# Patient Record
Sex: Male | Born: 2000 | Race: Black or African American | Hispanic: No | Marital: Single | Smoking: Never smoker
Health system: Southern US, Community
[De-identification: ages and names within clinical notes are randomized; demographics above are authoritative.]

## PROBLEM LIST (undated history)

## (undated) ENCOUNTER — Emergency Department (HOSPITAL_COMMUNITY): Admission: EM | Payer: Medicare Other | Source: Home / Self Care

## (undated) DIAGNOSIS — R2681 Unsteadiness on feet: Secondary | ICD-10-CM

## (undated) DIAGNOSIS — H47619 Cortical blindness, unspecified side of brain: Secondary | ICD-10-CM

## (undated) DIAGNOSIS — Z982 Presence of cerebrospinal fluid drainage device: Secondary | ICD-10-CM

## (undated) DIAGNOSIS — R4189 Other symptoms and signs involving cognitive functions and awareness: Secondary | ICD-10-CM

## (undated) DIAGNOSIS — J45909 Unspecified asthma, uncomplicated: Secondary | ICD-10-CM

## (undated) DIAGNOSIS — IMO0002 Reserved for concepts with insufficient information to code with codable children: Secondary | ICD-10-CM

## (undated) DIAGNOSIS — R1311 Dysphagia, oral phase: Secondary | ICD-10-CM

## (undated) DIAGNOSIS — Q860 Fetal alcohol syndrome (dysmorphic): Secondary | ICD-10-CM

## (undated) DIAGNOSIS — G809 Cerebral palsy, unspecified: Secondary | ICD-10-CM

## (undated) DIAGNOSIS — H4720 Unspecified optic atrophy: Secondary | ICD-10-CM

## (undated) DIAGNOSIS — R569 Unspecified convulsions: Secondary | ICD-10-CM

## (undated) DIAGNOSIS — Z9181 History of falling: Secondary | ICD-10-CM

## (undated) DIAGNOSIS — Q039 Congenital hydrocephalus, unspecified: Secondary | ICD-10-CM

## (undated) DIAGNOSIS — F909 Attention-deficit hyperactivity disorder, unspecified type: Secondary | ICD-10-CM

## (undated) HISTORY — PX: CSF SHUNT: SHX92

## (undated) HISTORY — PX: OTHER SURGICAL HISTORY: SHX169

## (undated) HISTORY — PX: HERNIA REPAIR: SHX51

---
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
## 2020-02-22 ENCOUNTER — Other Ambulatory Visit: Payer: Self-pay

## 2020-02-22 ENCOUNTER — Ambulatory Visit (INDEPENDENT_AMBULATORY_CARE_PROVIDER_SITE_OTHER): Payer: Medicaid Other | Admitting: Family Medicine

## 2020-02-22 ENCOUNTER — Encounter: Payer: Self-pay | Admitting: Family Medicine

## 2020-02-22 VITALS — BP 97/70 | HR 68 | Ht 62.21 in | Wt 96.8 lb

## 2020-02-22 DIAGNOSIS — E063 Autoimmune thyroiditis: Secondary | ICD-10-CM | POA: Diagnosis not present

## 2020-02-22 DIAGNOSIS — R6252 Short stature (child): Secondary | ICD-10-CM

## 2020-02-22 DIAGNOSIS — Z7689 Persons encountering health services in other specified circumstances: Secondary | ICD-10-CM

## 2020-02-22 DIAGNOSIS — R636 Underweight: Secondary | ICD-10-CM

## 2020-02-22 DIAGNOSIS — D696 Thrombocytopenia, unspecified: Secondary | ICD-10-CM

## 2020-02-22 DIAGNOSIS — H47619 Cortical blindness, unspecified side of brain: Secondary | ICD-10-CM

## 2020-02-22 NOTE — Patient Instructions (Addendum)
Wonderful to meet you guys today. We will get the records from [facility] pediatrics and then assess if we need to get follow-up labs on his next visit. Please make sure he is getting a well-balanced diet to maintain his weight. I would like to see you guys back in approximately 1-2 months or sooner if needed.

## 2020-02-22 NOTE — Assessment & Plan Note (Signed)
Appears to be stable on Synthroid 50 mcg.  Obtain records as discussed above, will get TSH on follow-up if not drawn recently.

## 2020-02-22 NOTE — Progress Notes (Signed)
    SUBJECTIVE:   CHIEF COMPLAINT / HPI: Establish care/new patient  Aaron Heath is an 19 year old male presenting to establish care:  Presents with his caretaker today, under DSS care.  Lives with his caretaker, has been staying with her for the past year and is going well.  Does not believe they need any additional assistance.  Both of his parents are deceased, passed away when he was younger.  They have no concerns or complaints today.  He is getting his first Covid vaccine tomorrow, [employer].  Reports he does not cook or drive.  Bathes on his own.  Cannot dress or use the restroom without verbal cues.  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 Hyacinth Meeker  Neurologist: [facility], once yearly    Psychiatrist: Dr. Jannifer Franklin   PT- hasn't seen since COVID   Neurosurgeon: [facility], once yearly  Eye doctor: Follows with [facility], due for follow-up  Social history: Goes to [employer] for school M-F in person.  Lives with caretaker.  Enjoys playing at the park for fun.  No alcohol or tobacco use.  Medications:  Abilify 10 mg twice daily  Zyrtec 10 mg daily  Depakote 500 mg twice daily  Synthroid 50 mcg daily  MiraLAX every other day  Melatonin 3 mg  Hydroxyzine 25 mg as needed  Guanfacine 2mg    Past surgical history: Eye surgery in 2013, VP shunt placement for hydrocephalus  Family history: No significant family history of early heart attack or strokes.  OBJECTIVE:   BP 97/70   Pulse 68   Ht 5' 2.21"  (1.58 m)   Wt 96 lb 12.8 oz (43.9 kg)   SpO2 99%   BMI 17.59 kg/m   General: Alert, NAD HEENT: NCAT, VP shunt palpable through neck/chest with previous posterior cranial scar, MMM, hypertropia Cardiac: RRR no m/g/r Lungs: Clear bilaterally, no increased WOB  Abdomen: soft Msk: Moves all extremities spontaneously, can walk without assistance Ext: Warm, dry, 2+ distal pulses Psych: Moderate mental disability, can follow commands and answer basic questions.   ASSESSMENT/PLAN:   Encounter to establish care with new doctor Reviewed past medical, surgical, and social history with patient and caretaker.  Reviewed and updated medications.  Will obtain records from [facility] pediatrics, previous PCP.  Cortical blindness Reports due to follow-up with ophthalmologist, caretaker to schedule appointment.  Low weight <1st percentile for weight, appears to be his general trajectory over the past several years.  Caretaker reports he eats well, encouraged nutrition dense foods to maintain weight.  Previously tried Ensure without success.   Thrombocytopenia, unspecified Unclear etiology at this point.  Will obtain records from [facility] pediatrics to assess and see if they have recently obtained a CBC.  If not, will get a CBC on follow-up.  Hypothyroidism, acquired, autoimmune Appears to be stable on Synthroid 50 mcg.  Obtain records as discussed above, will get TSH on follow-up if not drawn recently.    Follow-up in approximately 2 months or sooner if needed.  [facility], DO [place] [facility]

## 2020-02-22 NOTE — Assessment & Plan Note (Signed)
<  1st percentile for weight, appears to be his general trajectory over the past several years.  Caretaker reports he eats well, encouraged nutrition dense foods to maintain weight.  Previously tried Ensure without success.

## 2020-02-22 NOTE — Assessment & Plan Note (Signed)
Reviewed past medical, surgical, and social history with patient and caretaker.  Reviewed and updated medications.  Will obtain records from [facility] pediatrics, previous PCP.

## 2020-02-22 NOTE — Assessment & Plan Note (Signed)
Unclear etiology at this point.  Will obtain records from [facility] pediatrics to assess and see if they have recently obtained a CBC.  If not, will get a CBC on follow-up.

## 2020-02-22 NOTE — Assessment & Plan Note (Signed)
Reports due to follow-up with ophthalmologist, caretaker to schedule appointment.

## 2020-04-09 ENCOUNTER — Ambulatory Visit: Payer: Medicaid Other | Admitting: Family Medicine

## 2020-04-09 NOTE — Progress Notes (Deleted)
    SUBJECTIVE:   CHIEF COMPLAINT / HPI: Follow up   HPI: Aaron Heath is a 19 year old male presenting with his caretaker for follow-up.  Recently established care within our clinic in 02/2020.  ***  PERTINENT  PMH / PSH: Cerebral palsy, fetal alcohol syndrome, congenital hydrocephalus s/p VP shunt, acquired hypothyroidism on Synthroid replacement, moderate mental disability, seizure disorder, cortical blindness, derma cytopenia, ADHD with oppositional defiant disorder  OBJECTIVE:   There were no vitals taken for this visit.  ***  ASSESSMENT/PLAN:   No problem-specific Assessment & Plan notes found for this encounter.     Allayne Stack, DO [place] [facility]

## 2020-07-09 ENCOUNTER — Ambulatory Visit: Payer: Medicaid Other | Admitting: Family Medicine

## 2020-07-09 NOTE — Progress Notes (Deleted)
    SUBJECTIVE:   CHIEF COMPLAINT / HPI: Follow-up  Aaron Heath is an 19 year old gentleman presenting with his caretaker for follow-up.  He recently established care within our clinic in 02/2020.  During his initial evaluation, we discussed obtaining a TSH and CBC pending records from [facility] pediatrics.  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  ***  ASSESSMENT/PLAN:   No problem-specific Assessment & Plan notes found for this encounter.     Allayne Stack, DO [place] [facility]

## 2020-07-25 ENCOUNTER — Other Ambulatory Visit: Payer: Self-pay

## 2020-07-25 ENCOUNTER — Ambulatory Visit (INDEPENDENT_AMBULATORY_CARE_PROVIDER_SITE_OTHER): Payer: Medicaid Other | Admitting: Family Medicine

## 2020-07-25 VITALS — BP 98/60 | HR 82 | Temp 98.5°F | Wt 89.0 lb

## 2020-07-25 DIAGNOSIS — D696 Thrombocytopenia, unspecified: Secondary | ICD-10-CM

## 2020-07-25 DIAGNOSIS — E063 Autoimmune thyroiditis: Secondary | ICD-10-CM

## 2020-07-25 DIAGNOSIS — Z68.41 Body mass index (BMI) pediatric, less than 5th percentile for age: Secondary | ICD-10-CM

## 2020-07-25 DIAGNOSIS — J302 Other seasonal allergic rhinitis: Secondary | ICD-10-CM | POA: Diagnosis not present

## 2020-07-25 DIAGNOSIS — R636 Underweight: Secondary | ICD-10-CM | POA: Diagnosis not present

## 2020-07-25 MED ORDER — CETIRIZINE HCL 10 MG PO TABS: 10.0000 mg | ORAL_TABLET | Freq: Every day | ORAL | 3 refills | Status: DC

## 2020-07-25 MED ORDER — ENSURE ENLIVE PO LIQD: 237.0000 mL | Freq: Two times a day (BID) | ORAL | 12 refills | Status: DC

## 2020-07-25 NOTE — Patient Instructions (Signed)
Getting thyroid and platelet levels. Will call with results in the next few days.   Please start giving ensure twice daily in between meals for weight gain.

## 2020-07-25 NOTE — Progress Notes (Signed)
    SUBJECTIVE:   CHIEF COMPLAINT / HPI: Follow-up  Aaron Heath is a 19 year old male presenting with his caretaker for follow-up.  Last seen in March when establishing care in our clinic.  He and his caretaker report that he has been doing great.  No concerns today. Received his Pfizer on 3/11 and 03/15/2020. Eating wonderfully and has a good appetite per caretaker report, plenty of lean meats/protein. Been swimming and went to the beach recently.  Just completed summer school, going into 12th grade. Virtual appt with his neurologist coming up in the next few months.  Does not need any refills of his medications.  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  Will check TSH today.  Thrombocytopenia, unspecified Will obtain CBC to assess if persistent.  Low weight BMI 16, weight has down trended since last visit despite adequate appetite and food intake per caretaker report.  Encouraged adding Ensure BID between meals to see if he can continue to maintain weight in addition to offering nutritionally dense foods such as avocado, sweet potato  etc. Will monitor closely.  Seasonal allergies Rx Zyrtec.    Follow-up in 1 month to check in on weight, or sooner if labs significantly abnormal.  Allayne Stack, DO [facility]

## 2020-07-27 ENCOUNTER — Encounter: Payer: Self-pay | Admitting: Family Medicine

## 2020-07-27 DIAGNOSIS — J302 Other seasonal allergic rhinitis: Secondary | ICD-10-CM | POA: Insufficient documentation

## 2020-07-27 LAB — CBC
Hematocrit: 48.2 % (ref 37.5–51.0)
Hemoglobin: 16.3 g/dL (ref 13.0–17.7)
MCH: 29.7 pg (ref 26.6–33.0)
MCHC: 33.8 g/dL (ref 31.5–35.7)
MCV: 88 fL (ref 79–97)
RBC: 5.49 x10E6/uL (ref 4.14–5.80)
RDW: 13.9 % (ref 11.6–15.4)
WBC: 2.7 10*3/uL — ABNORMAL LOW (ref 3.4–10.8)

## 2020-07-27 LAB — TSH: TSH: 1.45 u[IU]/mL (ref 0.450–4.500)

## 2020-07-27 LAB — HEPATITIS C ANTIBODY: Hep C Virus Ab: <0.1 s/co ratio (ref 0.0–0.9)

## 2020-07-27 NOTE — Assessment & Plan Note (Signed)
Will obtain CBC to assess if persistent.

## 2020-07-27 NOTE — Assessment & Plan Note (Addendum)
BMI 16, weight has down trended since last visit despite adequate appetite and food intake per caretaker report.  Encouraged adding Ensure BID between meals to see if he can continue to maintain weight in addition to offering nutritionally dense foods such as avocado, sweet potato etc. Will monitor closely.

## 2020-07-27 NOTE — Assessment & Plan Note (Signed)
- 

## 2020-07-27 NOTE — Assessment & Plan Note (Signed)
Currently on Synthroid 50 mcg.  Will check TSH today.

## 2020-08-22 ENCOUNTER — Ambulatory Visit (INDEPENDENT_AMBULATORY_CARE_PROVIDER_SITE_OTHER): Payer: Medicaid Other | Admitting: Family Medicine

## 2020-08-22 ENCOUNTER — Other Ambulatory Visit: Payer: Self-pay

## 2020-08-22 VITALS — BP 99/70 | HR 63 | Ht 63.58 in | Wt 91.4 lb

## 2020-08-22 DIAGNOSIS — D696 Thrombocytopenia, unspecified: Secondary | ICD-10-CM

## 2020-08-22 DIAGNOSIS — R627 Adult failure to thrive: Secondary | ICD-10-CM

## 2020-08-22 NOTE — Progress Notes (Signed)
    SUBJECTIVE:   CHIEF COMPLAINT / HPI: Follow-up weight  Aaron Heath is a 19 year old male presenting for a weight check.  He was last seen on 8/11 for a regular follow-up.  He weighed 40.4 kg during that visit which was down from 43.9 kg in 02/2020.  He has always been under the 3rd percentile in weight.  His caretaker states he eats extremely well, eats 3 meals a day and has added the Ensure shakes twice daily.  He likes the Ensure shakes and has been a good addition.  Enjoys salads and lean meats.  Otherwise acting like himself.  No change in behavior.  Denies any recent fever, difficulty breathing, abdominal pain, melena/hematochezia.  He also has a history of thrombocytopenia and attempted to recheck last visit however platelets clumped.  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  Recommended increasing Ensure to TID between meals if he can maintain appropriate intake with meals in addition to this.  Will additionally place dietitian consult for in-depth caloric evaluation.  HIV to r/o alternative etiology, however doubtful.  TSH recently WNL.  Thrombocytopenia, unspecified Longstanding and previously stable.  Will recheck CBC today.    Follow-up in 1 month or sooner if  needed.  Allayne Stack, DO [place] [facility]

## 2020-08-22 NOTE — Patient Instructions (Signed)
It was wonderful to see you guys today.  We are going to recheck his platelets and hopefully the lab will go through this time.  Fortunately he has increased his weight a little bit since last visit.  We can consider increasing his Ensure to 3 times daily to see if this helps.  I will also place referral to nutritionist for more in-depth evaluation to make sure that we are not missing any nutrition gaps.  Follow-up in 1-2 months pending nutrition eval.

## 2020-08-23 LAB — CBC WITH DIFFERENTIAL/PLATELET
Basophils Absolute: 0 10*3/uL (ref 0.0–0.2)
Basos: 0 %
EOS (ABSOLUTE): 0 10*3/uL (ref 0.0–0.4)
Eos: 1 %
Hematocrit: 40.3 % (ref 37.5–51.0)
Hemoglobin: 13.4 g/dL (ref 13.0–17.7)
Immature Grans (Abs): 0 10*3/uL (ref 0.0–0.1)
Immature Granulocytes: 0 %
Lymphocytes Absolute: 2.9 10*3/uL (ref 0.7–3.1)
Lymphs: 56 %
MCH: 29.4 pg (ref 26.6–33.0)
MCHC: 33.3 g/dL (ref 31.5–35.7)
MCV: 88 fL (ref 79–97)
Monocytes Absolute: 0.4 10*3/uL (ref 0.1–0.9)
Monocytes: 8 %
Neutrophils Absolute: 1.8 10*3/uL (ref 1.4–7.0)
Neutrophils: 35 %
Platelets: 105 10*3/uL — ABNORMAL LOW (ref 150–450)
RBC: 4.56 x10E6/uL (ref 4.14–5.80)
RDW: 14.1 % (ref 11.6–15.4)
WBC: 5.1 10*3/uL (ref 3.4–10.8)

## 2020-08-23 LAB — HIV ANTIBODY (ROUTINE TESTING W REFLEX): HIV Screen 4th Generation wRfx: NONREACTIVE

## 2020-08-25 ENCOUNTER — Encounter: Payer: Self-pay | Admitting: Family Medicine

## 2020-08-25 DIAGNOSIS — R627 Adult failure to thrive: Secondary | ICD-10-CM | POA: Insufficient documentation

## 2020-08-25 NOTE — Assessment & Plan Note (Signed)
Longstanding and previously stable.  Will recheck CBC today.

## 2020-08-25 NOTE — Assessment & Plan Note (Addendum)
Weight up 1 kg since last visit, however still down from previous.  Recommended increasing Ensure to TID between meals if he can maintain appropriate intake with meals in addition to this.  Will additionally place dietitian consult for in-depth caloric evaluation.  HIV to r/o alternative etiology, however doubtful.  TSH recently WNL.

## 2020-09-20 ENCOUNTER — Ambulatory Visit: Payer: Medicaid Other | Admitting: Registered"

## 2020-09-26 ENCOUNTER — Ambulatory Visit (INDEPENDENT_AMBULATORY_CARE_PROVIDER_SITE_OTHER): Payer: Medicaid Other | Admitting: Family Medicine

## 2020-09-26 ENCOUNTER — Other Ambulatory Visit: Payer: Self-pay

## 2020-09-26 ENCOUNTER — Encounter: Payer: Self-pay | Admitting: Family Medicine

## 2020-09-26 VITALS — BP 98/56 | HR 56 | Ht 64.0 in | Wt 92.0 lb

## 2020-09-26 DIAGNOSIS — R636 Underweight: Secondary | ICD-10-CM | POA: Diagnosis not present
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
## 2020-09-26 NOTE — Patient Instructions (Addendum)
It was wonderful to see you both today.  I am glad to hear that he is doing well and will be establishing with dietitian here soon.  I encourage you to continue with the Ensure shakes and eating as regular.  Please follow-up in about 2 months to check in or sooner if needed.

## 2020-09-27 ENCOUNTER — Encounter: Payer: Self-pay | Admitting: Family Medicine

## 2020-09-27 NOTE — Assessment & Plan Note (Addendum)
Up 0.2 kg since last visit in 08/2020.  Do question if 02/2020 weigh in was inaccurate leaning towards a general poor weight growth other than decreased.  Reassured by appropriate appetite and ability to eat, likely has increased expenditure in the setting of CP.  Keep Ensure BID/TID. Maintain appointment with dietitian in 10/2020 to ensure meeting adequate caloric needs.

## 2020-10-17 ENCOUNTER — Encounter: Payer: Self-pay | Admitting: Registered"

## 2020-10-17 ENCOUNTER — Encounter: Payer: Medicaid Other | Attending: Family Medicine | Admitting: Registered"

## 2020-10-17 ENCOUNTER — Other Ambulatory Visit: Payer: Self-pay

## 2020-10-17 DIAGNOSIS — Z713 Dietary counseling and surveillance: Secondary | ICD-10-CM

## 2020-10-17 NOTE — Patient Instructions (Addendum)
-   Find out what he's eating at school.   - Have whole milk as beverage option while at school with breakfast and lunch.   - Switch from Ensure to Ensure Plus 2-3x/day.

## 2020-10-17 NOTE — Progress Notes (Signed)
Medical Nutrition Therapy:  Appt start time: 8:18 end time:  9:00.  Assessment:  Primary concerns today: Pt arrives with caretaker. Pt lives with caretaker. Has been living with her form 2 years. Previous notes state pt has trended less than 1% on weight-for-age growth charts throughout life. Recent note, 09/26/2020, states pt is around 0.01% and has experienced decline from baseline. Needs to increase nutritional intake.  Caretaker states PCP says he has something going on with his platelets and possibly the cause of unable to gain weight. Reports his weight is up and down.   Caretaker states he gets home late during the week (on Tuesdays and Thursdays)  around 7:30-8 pm and its time for him to get ready for bed; unable to have bedtime snack. States he is with a Surveyor, minerals during this time. Will eat dinner with him. States she has to be mindful of how much he is taking in so he doesn't soil his clothes. Reports his day starts at 5 am, therefore goes to bed early. States on Mondays and Fridays, she picks him up from school and he is able to have 3 Ensures/day, with breakfast, after school snack, and dinner.   Reports pt eats breakfast 2x/day, once at home and then at school. Reports he also eats lunch at school, after school snack at home, and dinner at home. Reports pt loves salad and will prioritize that over other parts of the meal. Caretaker states she tries to feed him starches that include vegetables to help with weight gain. Reports pt will eat some pasta dishes such as spaghetti, but not alfredo. Pt does not like bread unless its pizza.    Expectations: none stated  Preferred Learning Style:   No preference indicated   Learning Readiness:   Ready  Change in progress   MEDICATIONS: See list   DIETARY INTAKE:  Usual eating pattern includes 4 meals and 1 snack per day.  Everyday foods include boiled eggs, sausage links, cereal, .  Avoided foods include red meat, vegetables  (salads), sandwiches, Ramen noodles, bread.    24-hr recall:  B ( AM): Ensure + 2 boiled eggs + 2 chicken sausage links or cereal with whole milk B ( AM): unsure; eats at school Snk ( AM):  L ( PM): unsure; eats at school  Snk ( PM): Ensure + ind bag of chips D ( PM): Ensure (sometimes) + 3 soft tacos (veggie crumbles, cheese, lettuce, tomato, sour cream) Snk ( PM):  Beverages: Ensure   Usual physical activity: none reported, walking sometimes with worker  Estimated energy needs: 2600-2800 calories 325-350 g carbohydrates 130-140 g protein 87-93 g fat  Progress Towards Goal(s):  In progress.   Nutritional Diagnosis:  [place]-3.1 Underweight As related to lack of appropriate weight gain per day.  As evidenced by weight for age < 0.01 % .    Intervention: Caretaker was educated and counseled on ways to increase nourishment for patient. Discussed benefits of having Ensure Plus rather than Ensure. Encouraged caretaker to continue having higher fat and calorically dense food options. Caretaker was in agreement with goals listed. Goals: - Find out what he's eating at school.  - Have whole milk as beverage option while at school with breakfast and lunch.  - Switch from Ensure to Ensure Plus 2-3x/day.   Teaching Method Utilized:  Visual Auditory Hands on  Handouts given during visit include:  High-calorie Nutrition Therapy  Barriers to learning/adherence to lifestyle change: none identified  Demonstrated degree of understanding  via:  Teach Back   Monitoring/Evaluation:  Dietary intake, exercise, and body weight in 1 month(s).

## 2020-11-21 ENCOUNTER — Encounter: Payer: Self-pay | Admitting: Family Medicine

## 2020-11-21 ENCOUNTER — Ambulatory Visit (INDEPENDENT_AMBULATORY_CARE_PROVIDER_SITE_OTHER): Payer: Medicaid Other | Admitting: Family Medicine

## 2020-11-21 ENCOUNTER — Other Ambulatory Visit: Payer: Self-pay

## 2020-11-21 VITALS — BP 99/60 | HR 62 | Ht 64.0 in | Wt 94.0 lb

## 2020-11-21 DIAGNOSIS — E063 Autoimmune thyroiditis: Secondary | ICD-10-CM

## 2020-11-21 DIAGNOSIS — Z23 Encounter for immunization: Secondary | ICD-10-CM | POA: Diagnosis not present

## 2020-11-21 DIAGNOSIS — J302 Other seasonal allergic rhinitis: Secondary | ICD-10-CM | POA: Diagnosis not present

## 2020-11-21 DIAGNOSIS — R627 Adult failure to thrive: Secondary | ICD-10-CM | POA: Diagnosis not present

## 2020-11-21 DIAGNOSIS — R636 Underweight: Secondary | ICD-10-CM

## 2020-11-21 DIAGNOSIS — Z0000 Encounter for general adult medical examination without abnormal findings: Secondary | ICD-10-CM | POA: Diagnosis not present

## 2020-11-21 DIAGNOSIS — G4700 Insomnia, unspecified: Secondary | ICD-10-CM

## 2020-11-21 MED ORDER — CETIRIZINE HCL 10 MG PO TABS: 10.0000 mg | ORAL_TABLET | Freq: Every day | ORAL | 3 refills | Status: DC

## 2020-11-21 MED ORDER — LEVOTHYROXINE SODIUM 50 MCG PO TABS: 50.0000 ug | ORAL_TABLET | Freq: Every day | ORAL | 1 refills | Status: DC

## 2020-11-21 MED ORDER — MELATONIN 3 MG PO TABS: 3.0000 mg | ORAL_TABLET | Freq: Every day | ORAL | 3 refills | Status: DC

## 2020-11-21 MED ORDER — CENTRUM PO CHEW: 1.0000 | CHEWABLE_TABLET | Freq: Every day | ORAL | 3 refills | Status: AC

## 2020-11-21 MED ORDER — VITAMIN D3 25 MCG (1000 UNIT) PO TABS: 1000.0000 [IU] | ORAL_TABLET | Freq: Every day | ORAL | 3 refills | Status: DC

## 2020-11-21 NOTE — Patient Instructions (Addendum)
So glad to see he is gaining weight!  Keep up with the ensure plus twice or three times daily.  Work with nutrition dense foods.  Have a wonderful holiday!

## 2020-11-21 NOTE — Assessment & Plan Note (Addendum)
Weight continues to slowly uptrend, up 1 kg since last visit however still down from previous (still question if previous inaccurate weights may be leaning towards general poor weight growth rather than decreased).  May be in setting of increased expenditure with known CP chronic comorbidities.  Will continue regimen as is with Ensure Plus BID/TID, daily MVI, and adding nutritionally dense meals. Continue follow-up with dietitian team, next appointment tomorrow, 12/9.

## 2020-11-21 NOTE — Progress Notes (Signed)
    SUBJECTIVE:   CHIEF COMPLAINT / HPI: Follow-up weight  Aaron Heath is a 19 year old male presenting with his caretaker to follow-up his weight.  He has been doing well since our last visit in October.  Met with dietitian in November, recommended transitioning to Ensure Plus and continue adding nutritionally dense foods.  She feels like her appointment with the dietitian, Clementeen Hoof, was very helpful.  They have a follow-up appointment with her tomorrow.  His caretaker is continue to add breads and potatoes into his diet, discussed this with his school team to make sure he is getting adequate meals while at school.  Still taking Ensure Plus 2-3 times daily.  Following up with his neurologist and psychiatrist within the next month.  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 with improved vision,Thrombocytopenia- unclear etiology,ADHD, oppositional defiant  OBJECTIVE:   BP 99/60   Pulse 62   Ht $R'5\' 4"'xG$  (1.626 m)   Wt 94 lb (42.6 kg)   SpO2 99%   BMI 16.14 kg/m   General: Alert, NAD HEENT: NCAT, MMM, exotropia present Cardiac: RRR  Lungs: Clear bilaterally, no increased WOB  Abdomen: soft Msk: Moves all extremities spontaneously  Ext: Warm, dry  ASSESSMENT/PLAN:   Low weight Weight continues to slowly uptrend, up 1 kg since last visit however still down from previous (still question if previous inaccurate weights may be leaning towards general poor weight growth rather than decreased).  May be in setting of increased expenditure with known CP chronic comorbidities.  Will continue regimen as is with Ensure Plus BID/TID, daily MVI, and adding nutritionally dense meals. Continue follow-up with  dietitian team, next appointment tomorrow, 12/9.  Hypothyroidism, acquired, autoimmune TSH WNL in 07/2020.  Sent in refills of Synthroid 50 mcg.  Health care maintenance Received flu shot today.    Follow-up in 3 months for above or sooner if needed.  Patriciaann Clan, [place]

## 2020-11-21 NOTE — Assessment & Plan Note (Signed)
Received flu shot today. 

## 2020-11-21 NOTE — Assessment & Plan Note (Deleted)
Weight continues to slowly uptrend, up 1 kg since last visit however still down from previous (

## 2020-11-21 NOTE — Assessment & Plan Note (Signed)
TSH WNL in 07/2020.  Sent in refills of Synthroid 50 mcg.

## 2020-11-22 ENCOUNTER — Ambulatory Visit: Payer: Medicaid Other | Admitting: Registered"

## 2020-12-06 ENCOUNTER — Other Ambulatory Visit: Payer: Self-pay

## 2020-12-06 ENCOUNTER — Encounter: Payer: Self-pay | Admitting: *Deleted

## 2020-12-06 ENCOUNTER — Ambulatory Visit
Admission: EM | Admit: 2020-12-06 | Discharge: 2020-12-06 | Disposition: A | Discharge status: Home / Self Care | Payer: Medicaid Other

## 2020-12-06 DIAGNOSIS — Z711 Person with feared health complaint in whom no diagnosis is made: Secondary | ICD-10-CM
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
## 2020-12-06 NOTE — Discharge Instructions (Signed)
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
## 2020-12-18 ENCOUNTER — Ambulatory Visit: Payer: Medicaid Other | Admitting: Registered"

## 2021-01-03 ENCOUNTER — Encounter: Payer: Self-pay | Admitting: Registered"

## 2021-01-03 ENCOUNTER — Encounter: Payer: Medicaid Other | Attending: Family Medicine | Admitting: Registered"

## 2021-01-03 ENCOUNTER — Other Ambulatory Visit: Payer: Self-pay

## 2021-01-03 DIAGNOSIS — R627 Adult failure to thrive: Secondary | ICD-10-CM | POA: Insufficient documentation

## 2021-01-03 NOTE — Progress Notes (Signed)
  Medical Nutrition Therapy:  Appt start time: 8:11 end time: 8:30  Assessment:  Primary concerns today: Pt arrives with caretaker. Pt lives with caretaker. Caretaker states pt eats lunch provided by [employer], menu provided online. States she has increased Ensure intake to Ensure Plus 3 times a day. He drinks one with breakfast at home, has one with afternoon snack, and dinner. Reports pt likes ice cream; went to [employer] last week.  Pt states he likes going to the beach and eating at [employer]. States he likes to play video games and dance. States he hopes to go to beach for his birthday.    Expectations: none stated  Preferred Learning Style:   No preference indicated   Learning Readiness:   Ready  Change in progress   MEDICATIONS: See list   DIETARY INTAKE:  Usual eating pattern includes 4 meals and 1 snack per day.  Everyday foods include boiled eggs, sausage links, cereal, .  Avoided foods include red meat, vegetables (salads), sandwiches, Ramen noodles, bread.    24-hr recall:  B ( AM): Ensure Plus + 2 toaster strudel (sausage, bacon) + orange B ( AM): unsure; eats at school; Snk ( AM): individual bag of chips + water L ( PM): unsure; eats at school; baked chicken breast + 1/4 mac and cheese + 1/8 green beans + 1/4 mashed potatoes + water with Crystal Light Snk ( PM): Ensure Plus + individual bag of chips D ( PM): Ensure Plus (sometimes) + [employer] BBQ-flounder + cole slaw + fries + hush puppies  Snk ( PM):  Beverages: Ensure Plus, water  Usual physical activity: none reported, walking sometimes with worker  Estimated energy needs: 2600-2800 calories 325-350 g carbohydrates 130-140 g protein 87-93 g fat  Progress Towards Goal(s):  In progress.   Nutritional Diagnosis:  Hauser-3.1 Underweight As related to lack of appropriate weight gain per day.  As evidenced by weight for age < 0.01 % .    Intervention: Caretaker was educated and counseled on  ways to increase nourishment for patient. Encouraged with replacing Ensure with Ensure Plus. Discussed how to add in evening snack after dinner. Encouraged caretaker to continue having higher fat and calorically dense food options. Caretaker was in agreement with goals listed. Goals: - Continue to have 3 meals a day along with Ensure Plus 3 times a day. Great job with this! - Aim to have evening snack. Can be ice cream with sprinkles.  Teaching Method Utilized:  Visual Auditory Hands on  Handouts given during visit include:  High-calorie Nutrition Therapy  Barriers to learning/adherence to lifestyle change: none identified  Demonstrated degree of understanding via:  Teach Back   Monitoring/Evaluation:  Dietary intake, exercise, and body weight in 2 month(s).

## 2021-01-03 NOTE — Patient Instructions (Addendum)
-   Continue to have 3 meals a day along with Ensure Plus 3 times a day. Great job with this!  - Aim to have evening snack. Can be ice cream with sprinkles.

## 2021-02-15 ENCOUNTER — Encounter: Payer: Self-pay | Admitting: Family Medicine

## 2021-02-15 ENCOUNTER — Ambulatory Visit (INDEPENDENT_AMBULATORY_CARE_PROVIDER_SITE_OTHER): Payer: Medicare Other | Admitting: Family Medicine

## 2021-02-15 ENCOUNTER — Other Ambulatory Visit: Payer: Self-pay

## 2021-02-15 VITALS — BP 98/56 | HR 62 | Ht 64.0 in | Wt 93.8 lb

## 2021-02-15 DIAGNOSIS — R636 Underweight: Secondary | ICD-10-CM | POA: Diagnosis not present

## 2021-02-15 NOTE — Progress Notes (Signed)
    SUBJECTIVE:   CHIEF COMPLAINT / HPI: Check-in/follow-up weight  Aaron Heath is a 20 year old male presenting with his caretaker for follow-up.  His caretaker reports for approximately the past 2 or so weeks he has been favoring the Ensure drinks over solid food.  So much so that he continues to ask for an Ensure shake instead of food and then will not eat as much of his solid food.  Besides this, he has been at his baseline.  Denies any recent fever, acute illness, constipation/diarrhea, vomiting, nausea, abdominal pain.  He has always had a good appetite and no difficulty with eating including no choking, coughing, gagging, or pain with swallowing.  Has soft bowel movement every day.  No recent change in behavior.  In reference to why he is not wanting to eat his solid food as much reports " I don't know."   Now that the weather is getting nicer, he started to be more active outside.  His birthday is coming up soon, they are planning to go on a small vacation.  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,Moderate mental disability, education level around 20 years of age,Seizure disorder,Cortical blindness/esotropia/optic nerve atrophy-s/p eye surgery in 2013 with improved vision,Thrombocytopenia- unclear etiology,ADHD, oppositional defiant  OBJECTIVE:   BP (!) 98/56   Pulse 62   Ht 5\' 4"  (1.626 m)   Wt 93 lb 12.8 oz (42.5 kg)   SpO2 99%   BMI 16.10 kg/m   General: Alert, NAD HEENT: NCAT, MMM, exotropia present Lungs: No increased WOB  Abdomen: soft Msk: Can walk unassisted Ext: Warm, dry  ASSESSMENT/PLAN:   Low weight Weight largely unchanged since last visit in 11/2020, slightly under 5th percentile based off of CP growth chart (rather than normal adult trajectory).  Had lengthy discussion on offering foods he normally enjoys (caloric dense)  as to increase calories from whole food sources prior to ensure shakes. Do not feel enteral feeds are needed especially without concurrent feeding or respiratory difficulty. Follow-up with nutrition on 3/22, then with myself in approximately 2 weeks afterwards.   4/22, DO [facility]

## 2021-02-15 NOTE — Patient Instructions (Signed)
Wonderful to see you guys!   Keep up trying to alternate ensure with solid foods and encourage solid foods first.

## 2021-02-16 ENCOUNTER — Encounter: Payer: Self-pay | Admitting: Family Medicine

## 2021-02-16 NOTE — Assessment & Plan Note (Addendum)
Weight largely unchanged since last visit in 11/2020, slightly under 5th percentile based off of CP growth chart (rather than normal adult trajectory).  Had lengthy discussion on offering foods he normally enjoys (caloric dense) as to increase calories from whole food sources prior to ensure shakes. Do not feel enteral feeds are needed especially without concurrent feeding or respiratory difficulty. Follow-up with nutrition on 3/22, then with myself in approximately 2 weeks afterwards.

## 2021-03-05 ENCOUNTER — Other Ambulatory Visit: Payer: Self-pay

## 2021-03-05 ENCOUNTER — Encounter: Payer: Medicare Other | Attending: Family Medicine | Admitting: Registered"

## 2021-03-05 DIAGNOSIS — R627 Adult failure to thrive: Secondary | ICD-10-CM | POA: Diagnosis not present

## 2021-03-05 DIAGNOSIS — R636 Underweight: Secondary | ICD-10-CM | POA: Insufficient documentation

## 2021-03-05 DIAGNOSIS — Z713 Dietary counseling and surveillance: Secondary | ICD-10-CM | POA: Insufficient documentation

## 2021-03-05 NOTE — Patient Instructions (Addendum)
-   Aim to have give Ensure Plus after his meal.   - Aim to have evening snack. Can be ice cream with sprinkles.

## 2021-03-05 NOTE — Progress Notes (Signed)
  Medical Nutrition Therapy:  Appt start time: 8:08 end time: 8:35  Caretaker: [name]  Assessment:  Primary concerns today: Caretaker arrives stating pt had PCP appt recently; weight 93.8 lbs. States pt is not gaining weight like he should. States pt was depending on Ensure too much and no longer eating solid food. Caretaker states as a result she cut back on Ensure.  States they are going to start delivering Ensure so caretaker does not have to buy out of pocket.    Expectations: none stated  Preferred Learning Style:   No preference indicated   Learning Readiness:   Ready  Change in progress   MEDICATIONS: See list   DIETARY INTAKE:  Usual eating pattern includes 4 meals and 1 snack per day.  Everyday foods include boiled eggs, sausage links, cereal, .  Avoided foods include red meat, vegetables (salads), sandwiches, Ramen noodles, bread.    24-hr recall:  B ( AM): 3 mini pancakes with syrup + 1 egg + 1 [place] sausage + orange juice or Ensure Plus + 2 toaster strudel (sausage, bacon) + orange B ( AM): unsure; eats at school; Snk ( AM): individual bag of chips + water L ( PM): unsure; eats at school; baked chicken breast + 1/4 mac and cheese + 1/8 green beans + 1/4 mashed potatoes + water with Crystal Light Snk ( PM): individual bag of chips + capri juice D ( PM): 4 fish filets + sweet potato cole slaw + cole slaw + caprisun Snk ( PM): ice cream (with sprinkles) Beverages: water, capri sun  Usual physical activity: none reported, walking sometimes with worker  Estimated energy needs: 2600-2800 calories 325-350 g carbohydrates 130-140 g protein 87-93 g fat  Progress Towards Goal(s):  In progress.   Nutritional Diagnosis:  Wilson-3.1 Underweight As related to lack of appropriate weight gain per day.  As evidenced by weight for age < 0.01 % .    Intervention: Caretaker was educated and counseled on ways to increase nourishment for patient. Encouraged continuing to add  Ensure to meals 1-3 times/day but to provide after he eats his food. Encouraged caretaker to continue having higher fat and calorically dense food options. Encouraged snack after dinner. Caretaker was in agreement with goals listed. Goals: - Aim to have give Ensure Plus after his meal.  - Aim to have evening snack. Can be ice cream with sprinkles.   Teaching Method Utilized:  Visual Auditory Hands on  Handouts given during visit include:  High-calorie Nutrition Therapy  Barriers to learning/adherence to lifestyle change: none identified  Demonstrated degree of understanding via:  Teach Back   Monitoring/Evaluation:  Dietary intake, exercise, and body weight in 1 month(s).

## 2021-03-26 ENCOUNTER — Ambulatory Visit: Payer: Medicare Other | Admitting: Family Medicine

## 2021-04-07 NOTE — Progress Notes (Signed)
    SUBJECTIVE:   CHIEF COMPLAINT / HPI: Check in weight   Aaron Heath is a 20 year old male presenting with his caretaker for a follow-up of his weight.  He recently followed up with his dietitian on 3/22, recommended continuing Ensure 3 times daily after receiving his solid meals first.  Encouraged calorically dense foods.  His caretaker states they are still waiting to get the Ensure sent to them, for right now he is only doing 1 Ensure a day.  They are trying to do better about fattier foods.  He has been enjoying chicken nuggets, fries as his favorite things.  Appetite has been great.  Recently celebrated his birthday in [place]. Regular BM.   He saw his psychiatrist on 4/5 with no medication changes.  Reports they need to call his pediatric neurologist this week to get him scheduled for a general follow-up in the upcoming months.  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  Provided encouragement that despite his weight today, overall seem to be heading in the correct direction with calorically dense foods and awaiting Ensure TID after meals.  Do not  anticipate significant weight gain as this is likely his plateau as a young adult.  Follow-up with dietitian team on Wednesday, then with myself in approximately 2 months or sooner if needed.    Friday, DO [place] [facility]

## 2021-04-08 ENCOUNTER — Encounter: Payer: Self-pay | Admitting: Family Medicine

## 2021-04-08 ENCOUNTER — Other Ambulatory Visit: Payer: Self-pay

## 2021-04-08 ENCOUNTER — Ambulatory Visit (INDEPENDENT_AMBULATORY_CARE_PROVIDER_SITE_OTHER): Payer: Medicare Other | Admitting: Family Medicine

## 2021-04-08 VITALS — BP 100/60 | HR 61 | Ht 64.0 in | Wt 90.0 lb

## 2021-04-08 DIAGNOSIS — R636 Underweight: Secondary | ICD-10-CM

## 2021-04-08 NOTE — Assessment & Plan Note (Signed)
Down since last visit in 02/2021, however overall has maintained within the past several months (90-95lbs), in the 5th percentile based off of CP growth chart.  Provided encouragement that despite his weight today, overall seem to be heading in the correct direction with calorically dense foods and awaiting Ensure TID after meals.  Do not anticipate significant weight gain as this is likely his plateau as a young adult.  Follow-up with dietitian team on Wednesday, then with myself in approximately 2 months or sooner if needed.

## 2021-04-08 NOTE — Patient Instructions (Addendum)
  Keep up the good work-- add caloric dense foods discussed with his dietician and the ensure shakes after meals.

## 2021-04-10 ENCOUNTER — Other Ambulatory Visit: Payer: Self-pay

## 2021-04-10 ENCOUNTER — Encounter: Payer: Self-pay | Admitting: Registered"

## 2021-04-10 ENCOUNTER — Encounter: Payer: Medicare Other | Attending: Family Medicine | Admitting: Registered"

## 2021-04-10 DIAGNOSIS — R627 Adult failure to thrive: Secondary | ICD-10-CM | POA: Insufficient documentation

## 2021-04-10 DIAGNOSIS — R636 Underweight: Secondary | ICD-10-CM | POA: Insufficient documentation

## 2021-04-10 DIAGNOSIS — Z713 Dietary counseling and surveillance: Secondary | ICD-10-CM | POA: Diagnosis not present

## 2021-04-10 NOTE — Progress Notes (Signed)
  Medical Nutrition Therapy:  Appt start time: 8:08 end time: 8:38  Caretaker: [name]  Assessment:  Primary concerns today: Caretaker arrives stating pt had PCP appt recently; weight 90 lbs, decrease of 3.8 lbs from 93.8 lbs last month. Caretaker states PCP is not concerned. States PCP says pt is just small and its who he is. States pt will not eat breakfast when drinking Ensure with medication in the mornings. States she also gives him Ensure at night after dinner and he will not drink it all. States he has been consuming Ensure with ice cream sometimes on the weekends.   Expectations: none stated  Preferred Learning Style:   No preference indicated   Learning Readiness:   Ready  Change in progress   MEDICATIONS: See list   DIETARY INTAKE:  Usual eating pattern includes 4 meals and 1 snack per day.  Everyday foods include boiled eggs, sausage links, cereal, .  Avoided foods include red meat, vegetables (salads), sandwiches, Ramen noodles, bread.    24-hr recall:  B ( AM): cereal (Berries crunch) + banana  B ( AM): unsure; eats at school; Snk ( AM): individual bag of chips + water L ( PM): unsure; eats at school; baked chicken breast + 1/4 mac and cheese + 1/8 green beans + 1/4 mashed potatoes + water with Crystal Light Snk ( PM): individual bag of chips + capri juice D ( PM): [employer]-4 pc tenders + double fries + Sprite  Snk ( PM): 1/4 Ensure Plus  Beverages: water, capri sun  Usual physical activity: none reported, walking sometimes with worker  Estimated energy needs: 2600-2800 calories 325-350 g carbohydrates 130-140 g protein 87-93 g fat  Progress Towards Goal(s):  In progress.   Nutritional Diagnosis:  [place]-3.1 Underweight As related to lack of appropriate weight gain per day.  As evidenced by weight for age < 0.01 % .    Intervention: Caretaker was educated and counseled on ways to increase nourishment for patient. Encouraged caretaker to continue with  established eating regimen and changes from previous visits. Caretaker was in agreement with goals listed. Goals: - Aim to have give Ensure Plus after breakfast and dinner.  - Have fluid other than Ensure when taking medications in the mornings.    Teaching Method Utilized:  Visual Auditory Hands on  Handouts given during visit include:  none  Barriers to learning/adherence to lifestyle change: none identified  Demonstrated degree of understanding via:  Teach Back   Monitoring/Evaluation:  Dietary intake, exercise, and body weight prn.

## 2021-06-10 ENCOUNTER — Ambulatory Visit: Payer: Medicare Other | Admitting: Family Medicine

## 2021-07-03 ENCOUNTER — Other Ambulatory Visit: Payer: Self-pay

## 2021-07-03 ENCOUNTER — Encounter: Payer: Self-pay | Admitting: Family Medicine

## 2021-07-03 ENCOUNTER — Ambulatory Visit (INDEPENDENT_AMBULATORY_CARE_PROVIDER_SITE_OTHER): Payer: Medicare Other | Admitting: Family Medicine

## 2021-07-03 VITALS — BP 100/60 | HR 69 | Ht 64.0 in | Wt 91.4 lb

## 2021-07-03 DIAGNOSIS — R627 Adult failure to thrive: Secondary | ICD-10-CM

## 2021-07-03 NOTE — Patient Instructions (Signed)
It was great seeing you today!  Today you came in for weight check which you have gained 1 pound 4 ounces.  Glad to hear he is doing well, and that dietitian essentially recommended no further checks or intervention is needed.   Please check-out at the front desk before leaving the clinic. I'd like to see you back in [name] back in the next 2 to 3 months for his physical, but if you need to be seen earlier than that for any new issues we're happy to fit you in, just give [place] a call!  If you haven't already, sign up for My Chart to have easy access to your labs results, and communication with your primary care physician.  Feel free to call with any questions or concerns at any time, at [phone].   Take care,  Dr. Cora Collum [facility]

## 2021-07-03 NOTE — Progress Notes (Signed)
    SUBJECTIVE:   CHIEF COMPLAINT / HPI:   Aaron Heath is a 20 yo with a complex medical history including CP presents with his caregiver for a weight check scheduled by his prior PCP Dr. Annia Friendly. He currently weighs 91.4 up from 90 on 4/25. Weight was 93.8 in 3/4. Recent additions to his plan include ensures 1 for breakfast, 1 when he gets home from school and at 1 at night all after his meals. Caretaker says this has been a good regimen for him. She endorses normal Bms, and normal urination. It was previously discussed that patient's weight has likely plateaued. Caretaker states dietician told them there is no further follow up needed.   Declined Tdap at this time, will get it at next visit along with booster   PMH:  Cerebral palsy, Fetal alcohol syndrome, Congenital hydrocephalus s/p VP shunt, Acquired hypothyroidism with Hashimoto's thyroiditis on Synthroid replacement, Intellectual disability, Seizure disorder, ADHD, oppositional defiant disorder   OBJECTIVE:   BP 100/60   Pulse 69   Ht 5\' 4"  (1.626 m)   Wt 91 lb 6.4 oz (41.5 kg)   SpO2 99%   BMI 15.69 kg/m    General: alert, cooperative, NAD CV: RRR no murmurs  Resp: CTAB normal WOB GI: soft, non distended  MSK: moves all extremities equaly and spontaneously   ASSESSMENT/PLAN:   No problem-specific Assessment & Plan notes found for this encounter.   Weight check  He currently weighs 91.4 up from 90 on 4/25, and over the past year has generally stayed between 90-95lbs. Patient has been around 5% on the CP growth chart. Do not anticipate significant weight gain moving forward. We have stopped the frequent weight checks, but of course are available if there are ever any questions or concerns from family.   Recommended follow up in about 2 months for annual physical, and to complete Tdap and COVID booster at that time     5/25, DO [facility]

## 2021-09-02 ENCOUNTER — Telehealth: Payer: Self-pay

## 2021-09-02 NOTE — Telephone Encounter (Signed)
Pharmacy called nurse line regarding rx for Centrum chewable. Pharmacy no longer carries this medication. Please send in alternative to [facility].   Veronda Prude, RN

## 2021-09-03 ENCOUNTER — Other Ambulatory Visit: Payer: Self-pay | Admitting: Family Medicine

## 2021-09-03 MED ORDER — FRUITY CHEWABLES MULTIVITAMIN PO CHEW: CHEWABLE_TABLET | ORAL | 3 refills | Status: DC

## 2021-10-08 ENCOUNTER — Other Ambulatory Visit: Payer: Self-pay

## 2021-10-08 DIAGNOSIS — E063 Autoimmune thyroiditis: Secondary | ICD-10-CM

## 2021-10-09 MED ORDER — LEVOTHYROXINE SODIUM 50 MCG PO TABS: 50.0000 ug | ORAL_TABLET | Freq: Every day | ORAL | 1 refills | Status: DC

## 2021-10-18 ENCOUNTER — Ambulatory Visit (INDEPENDENT_AMBULATORY_CARE_PROVIDER_SITE_OTHER): Payer: Medicare Other

## 2021-10-18 ENCOUNTER — Ambulatory Visit (INDEPENDENT_AMBULATORY_CARE_PROVIDER_SITE_OTHER): Payer: Medicare Other | Admitting: Family Medicine

## 2021-10-18 ENCOUNTER — Other Ambulatory Visit: Payer: Self-pay

## 2021-10-18 VITALS — BP 100/61 | HR 57 | Temp 98.4°F | Ht 64.0 in | Wt 96.8 lb

## 2021-10-18 DIAGNOSIS — E063 Autoimmune thyroiditis: Secondary | ICD-10-CM

## 2021-10-18 DIAGNOSIS — J302 Other seasonal allergic rhinitis: Secondary | ICD-10-CM | POA: Diagnosis not present

## 2021-10-18 DIAGNOSIS — R627 Adult failure to thrive: Secondary | ICD-10-CM

## 2021-10-18 DIAGNOSIS — Z23 Encounter for immunization: Secondary | ICD-10-CM

## 2021-10-18 DIAGNOSIS — G4700 Insomnia, unspecified: Secondary | ICD-10-CM

## 2021-10-18 DIAGNOSIS — Z68.41 Body mass index (BMI) pediatric, less than 5th percentile for age: Secondary | ICD-10-CM | POA: Diagnosis not present

## 2021-10-18 MED ORDER — ENSURE ENLIVE PO LIQD: 237.0000 mL | Freq: Two times a day (BID) | ORAL | 12 refills | Status: DC

## 2021-10-18 MED ORDER — CETIRIZINE HCL 10 MG PO TABS: 10.0000 mg | ORAL_TABLET | Freq: Every day | ORAL | 3 refills | Status: DC

## 2021-10-18 MED ORDER — GUANFACINE HCL 1 MG PO TABS: 1.0000 mg | ORAL_TABLET | Freq: Every day | ORAL | 0 refills | Status: DC

## 2021-10-18 MED ORDER — MELATONIN 3 MG PO TABS: 3.0000 mg | ORAL_TABLET | Freq: Every day | ORAL | 3 refills | Status: AC

## 2021-10-18 MED ORDER — LEVOTHYROXINE SODIUM 50 MCG PO TABS: 50.0000 ug | ORAL_TABLET | Freq: Every day | ORAL | 1 refills | Status: DC

## 2021-10-18 MED ORDER — POLYETHYLENE GLYCOL 3350 17 G PO PACK: 17.0000 g | PACK | Freq: Every day | ORAL | 3 refills | Status: DC

## 2021-10-18 MED ORDER — VITAMIN D3 25 MCG (1000 UNIT) PO TABS: 1000.0000 [IU] | ORAL_TABLET | Freq: Every day | ORAL | 3 refills | Status: DC

## 2021-10-18 NOTE — Assessment & Plan Note (Signed)
The patient is steadily gaining weight on his current regimen and no changes are necessary at this time. Further follow-up and monitoring can be done as necessary at future visits.

## 2021-10-18 NOTE — Progress Notes (Addendum)
    SUBJECTIVE:   CHIEF COMPLAINT / HPI:   Raydan Schlabach is a 20 y.o. male with a complex past medical history including cerebral palsy, presenting to the clinic for vaccination and a weight check.  Vaccinations The patient's mother has brought him to the clinic today for his COVID-19 booster and influenza vaccine. The patient has not had any recent infections or illness.  The patient denies nausea, vomiting, diarrhea, headaches, sleep difficulties, itchiness, parasthesias, cough, shortness of breath, excessive sweating, tremors, and excess fatigue.  He is taking all of his medications as prescribed.  Weight gain The patient has chronic poor weight gain. At this visit, he weighs 96.8 oz, up from 91.4 oz on 7/20 of this year. He is gaining weight steadily on his current regimen of ensure.  PERTINENT  PMH:   Cerebral palsy, Fetal alcohol syndrome, Congenital hydrocephalus s/p VP shunt, Acquired hypothyroidism with Hashimoto's thyroiditis on Synthroid replacement, Intellectual disability, Seizure disorder, ADHD, oppositional defiant disorder   PSH:  The patient does not drink alcohol, smoke cigarettes, or use any other substances.  OBJECTIVE:   BP 100/61   Pulse (!) 57   Temp 98.4 F (36.9 C) (Oral)   Ht 5\' 4"  (1.626 m)   Wt 96 lb 12.8 oz (43.9 kg)   SpO2 98%   BMI 16.62 kg/m    General: age-appropriate, resting comfortably in chair, NAD, WNWD, alert and at baseline HEENT: NCAT. PERRLA. Sclera without injection or icterus. MMM. Neck: Supple. No LAD, thyroid smooth and not palpable. Cardiovascular: Regular rate and rhythm. Normal S1/S2. No murmurs, rubs, or gallops appreciated. 2+ radial pulses. Pulmonary: Clear bilaterally to ascultation. No increased WOB, no accessory muscle usage. No wheezes, rales, or crackles. Extremities: no edema, cyanosis, or clubbing Psych: Pleasant and appropriate, complies with instructions  ASSESSMENT/PLAN:   Ustin Cruickshank is a 20  y.o. male presenting to the clinic for a routine check-up, medication refills, and vaccinations.  Vaccinations COVID-19 and influenza vaccines administered today.  Poor weight gain in adult The patient is steadily gaining weight on his current regimen and no changes are necessary at this time. Further follow-up and monitoring can be done as necessary at future visits.   Dimitry 26, Medical Student [facility]     I was personally present and performed or re-performed the history, physical exam and medical decision making activities of this service and have verified that the service and findings are accurately documented in the student's note.  [facility], DO                  10/18/2021, 5:40 PM

## 2021-10-18 NOTE — Patient Instructions (Signed)
It was great seeing you today!  Today you were seen for medication refills, Covid booster and flu vaccine.   Feel free to call with any questions or concerns at any time, at [phone].   Take care,  Dr. Cora Collum [facility]

## 2022-02-14 ENCOUNTER — Other Ambulatory Visit: Payer: Self-pay | Admitting: Family Medicine

## 2022-02-14 DIAGNOSIS — J302 Other seasonal allergic rhinitis: Secondary | ICD-10-CM

## 2022-02-21 ENCOUNTER — Other Ambulatory Visit: Payer: Self-pay | Admitting: Family Medicine

## 2022-02-21 DIAGNOSIS — R627 Adult failure to thrive: Secondary | ICD-10-CM

## 2022-02-22 ENCOUNTER — Emergency Department (HOSPITAL_BASED_OUTPATIENT_CLINIC_OR_DEPARTMENT_OTHER): Payer: Medicare Other

## 2022-02-22 ENCOUNTER — Encounter (HOSPITAL_BASED_OUTPATIENT_CLINIC_OR_DEPARTMENT_OTHER): Payer: Self-pay | Admitting: Emergency Medicine

## 2022-02-22 ENCOUNTER — Other Ambulatory Visit: Payer: Self-pay

## 2022-02-22 ENCOUNTER — Emergency Department (HOSPITAL_BASED_OUTPATIENT_CLINIC_OR_DEPARTMENT_OTHER)
Admission: EM | Admit: 2022-02-22 | Discharge: 2022-02-23 | Disposition: A | Discharge status: Home / Self Care | Payer: Medicare Other | Attending: Emergency Medicine | Admitting: Emergency Medicine

## 2022-02-22 DIAGNOSIS — R000 Tachycardia, unspecified: Secondary | ICD-10-CM | POA: Diagnosis not present

## 2022-02-22 DIAGNOSIS — R0981 Nasal congestion: Secondary | ICD-10-CM | POA: Insufficient documentation

## 2022-02-22 DIAGNOSIS — R059 Cough, unspecified: Secondary | ICD-10-CM | POA: Insufficient documentation

## 2022-02-22 DIAGNOSIS — B349 Viral infection, unspecified: Secondary | ICD-10-CM

## 2022-02-22 DIAGNOSIS — D72829 Elevated white blood cell count, unspecified: Secondary | ICD-10-CM | POA: Diagnosis not present

## 2022-02-22 DIAGNOSIS — Z20822 Contact with and (suspected) exposure to covid-19: Secondary | ICD-10-CM | POA: Diagnosis not present

## 2022-02-22 DIAGNOSIS — R2689 Other abnormalities of gait and mobility: Secondary | ICD-10-CM | POA: Diagnosis not present

## 2022-02-22 LAB — CBC WITH DIFFERENTIAL/PLATELET
Abs Immature Granulocytes: 0.24 10*3/uL — ABNORMAL HIGH (ref 0.00–0.07)
Basophils Absolute: 0.1 10*3/uL (ref 0.0–0.1)
Basophils Relative: 0 %
Eosinophils Absolute: 0.1 10*3/uL (ref 0.0–0.5)
Eosinophils Relative: 1 %
HCT: 41.6 % (ref 39.0–52.0)
Hemoglobin: 13.9 g/dL (ref 13.0–17.0)
Immature Granulocytes: 2 %
Lymphocytes Relative: 12 %
Lymphs Abs: 1.8 10*3/uL (ref 0.7–4.0)
MCH: 29.1 pg (ref 26.0–34.0)
MCHC: 33.4 g/dL (ref 30.0–36.0)
MCV: 87.2 fL (ref 80.0–100.0)
Monocytes Absolute: 2.5 10*3/uL — ABNORMAL HIGH (ref 0.1–1.0)
Monocytes Relative: 16 %
Neutro Abs: 10.6 10*3/uL — ABNORMAL HIGH (ref 1.7–7.7)
Neutrophils Relative %: 69 %
Platelets: 66 10*3/uL — ABNORMAL LOW (ref 150–400)
RBC: 4.77 MIL/uL (ref 4.22–5.81)
RDW: 12.9 % (ref 11.5–15.5)
Smear Review: NORMAL
WBC: 15.2 10*3/uL — ABNORMAL HIGH (ref 4.0–10.5)
nRBC: 0 % (ref 0.0–0.2)

## 2022-02-22 LAB — RESP PANEL BY RT-PCR (FLU A&B, COVID) ARPGX2
Influenza A by PCR: NEGATIVE
Influenza B by PCR: NEGATIVE
SARS Coronavirus 2 by RT PCR: NEGATIVE

## 2022-02-22 LAB — COMPREHENSIVE METABOLIC PANEL
ALT: 15 U/L (ref 0–44)
AST: 31 U/L (ref 15–41)
Albumin: 3.4 g/dL — ABNORMAL LOW (ref 3.5–5.0)
Alkaline Phosphatase: 71 U/L (ref 38–126)
Anion gap: 12 (ref 5–15)
BUN: 16 mg/dL (ref 6–20)
CO2: 22 mmol/L (ref 22–32)
Calcium: 8.8 mg/dL — ABNORMAL LOW (ref 8.9–10.3)
Chloride: 104 mmol/L (ref 98–111)
Creatinine, Ser: 1.02 mg/dL (ref 0.61–1.24)
GFR, Estimated: >60 mL/min (ref >60)
Glucose, Bld: 94 mg/dL (ref 70–99)
Potassium: 3.9 mmol/L (ref 3.5–5.1)
Sodium: 138 mmol/L (ref 135–145)
Total Bilirubin: 0.5 mg/dL (ref 0.3–1.2)
Total Protein: 7.4 g/dL (ref 6.5–8.1)

## 2022-02-22 MED ORDER — SODIUM CHLORIDE 0.9 % IV BOLUS
1000.0000 mL | Freq: Once | INTRAVENOUS | Status: AC
  Administered 2022-02-22: 1000 mL via INTRAVENOUS

## 2022-02-22 NOTE — ED Notes (Signed)
CBC had to be redrawn.  Patient difficult stick. ?

## 2022-02-22 NOTE — ED Triage Notes (Signed)
Per mom his gait is always altered but way worse today.  Reports he has had a runny nose today as well.  Notably drowsy in triage but mom reports he had his bedtime meds already.   ?

## 2022-02-22 NOTE — ED Provider Notes (Signed)
[facility] EMERGENCY DEPARTMENT Provider Note   CSN: [id number] Arrival date & time: 02/22/22  2051     History  Chief Complaint  Patient presents with   Gait Problem    Aaron Heath is a 21 y.o. male.  Patient is a 21 year old male who is here with his caretaker.  His legal guardian is [employer].  His caretaker is lived with him for the last 4 years.  He has a history of cerebral palsy, fetal alcohol syndrome, hydrocephalus with VP shunt.  Per the caretaker, he has been sick starting this morning.  He has had runny nose and nasal congestion.  She started some coughing a little bit of wheezing.  He has been much less active than he normally is and has had difficulty walking.  He normally has a little bit of problems with his gait but she said he has been weak and has not had the ability to walk effectively today due to the weakness.  She has not noticed any worse weakness on one side or the other.  No known fevers.  No vomiting or diarrhea.  He is not reporting any pain anywhere but he does not have good ability to communicate effectively.      Home Medications Prior to Admission medications   Medication Sig Start Date End Date Taking? Authorizing Provider  ARIPiprazole (ABILIFY) 10 MG tablet Take 15 mg by mouth in the morning and at bedtime.     [provider]  cetirizine (ZYRTEC) 10 MG tablet TAKE 1 TABLET BY MOUTH (=TO 10MG ) ONCE DAILY. 02/15/22   Cora Collum, DO  cholecalciferol (VITAMIN D) 25 MCG (1000 UNIT) tablet CHEW 1 TABLET BY MOUTH ONCE DAILY. 02/21/22   Cora Collum, DO  divalproex (DEPAKOTE) 500 MG DR tablet Take 500 mg by mouth in the morning and at bedtime.    [provider]  feeding supplement (ENSURE ENLIVE / ENSURE PLUS) LIQD Take 237 mLs by mouth 2 (two) times daily between meals. 10/18/21   Cora Collum, DO  guanFACINE (TENEX) 1 MG tablet Take 1 tablet (1 mg total) by mouth at bedtime. 10/18/21   Cora Collum, DO   hydrOXYzine (ATARAX/VISTARIL) 25 MG tablet Take 25 mg by mouth 3 (three) times daily as needed.    [provider]  levothyroxine (SYNTHROID) 50 MCG tablet Take 1 tablet (50 mcg total) by mouth daily before breakfast. 10/18/21   Paige, Lucas Mallow, DO  melatonin 3 MG TABS tablet Take 1 tablet (3 mg total) by mouth at bedtime. 10/18/21   Cora Collum, DO  multivitamin-iron-minerals-folic acid (CENTRUM) chewable tablet Chew 1 tablet by mouth daily. 11/21/20   Allayne Stack, DO  Pediatric Multiple Vit-C-FA (FRUITY CHEWABLES MULTIVITAMIN) CHEW Take once daily 09/03/21   Cora Collum, DO  polyethylene glycol (MIRALAX / GLYCOLAX) 17 g packet Take 17 g by mouth daily. 10/18/21   Cora Collum, DO      Allergies    Patient has no known allergies.    Review of Systems   Review of Systems  Unable to perform ROS: Patient nonverbal   Physical Exam Updated Vital Signs BP 100/78 (BP Location: Left Arm)   Pulse (!) 108   Temp 98.2 F (36.8 C) (Oral)   Resp 18   Ht 5\' 4"  (1.626 m)   Wt 43.9 kg   SpO2 98%   BMI 16.61 kg/m  Physical Exam Constitutional:      Appearance: He  is well-developed.  HENT:     Head: Normocephalic and atraumatic.     Nose: Congestion and rhinorrhea present.  Eyes:     Pupils: Pupils are equal, round, and reactive to light.  Cardiovascular:     Rate and Rhythm: Normal rate and regular rhythm.     Heart sounds: Normal heart sounds.  Pulmonary:     Effort: Pulmonary effort is normal. No respiratory distress.     Breath sounds: Normal breath sounds. No wheezing or rales.  Chest:     Chest wall: No tenderness.  Abdominal:     General: Bowel sounds are normal.     Palpations: Abdomen is soft.     Tenderness: There is no abdominal tenderness. There is no guarding or rebound.  Musculoskeletal:        General: Normal range of motion.     Cervical back: Normal range of motion and neck supple.  Lymphadenopathy:     Cervical: No cervical  adenopathy.  Skin:    General: Skin is warm and dry.     Findings: No rash.  Neurological:     General: No focal deficit present.     Mental Status: He is alert and oriented to person, place, and time.     Comments: He is following commands, no facial drooping, he has good grip strength bilaterally.  Normal motor strength to his lower extremities.  No focal deficits noted.    ED Results / Procedures / Treatments   Labs (all labs ordered are listed, but only abnormal results are displayed) Labs Reviewed  COMPREHENSIVE METABOLIC PANEL - Abnormal; Notable for the following components:      Result Value   Calcium 8.8 (*)    Albumin 3.4 (*)    All other components within normal limits  CBC WITH DIFFERENTIAL/PLATELET - Abnormal; Notable for the following components:   WBC 15.2 (*)    Platelets 66 (*)    All other components within normal limits  RESP PANEL BY RT-PCR (FLU A&B, COVID) ARPGX2  CBC WITH DIFFERENTIAL/PLATELET  URINALYSIS, ROUTINE W REFLEX MICROSCOPIC    EKG None  Radiology DG Chest 2 View  Result Date: 02/22/2022 CLINICAL DATA:  Weakness, cough EXAM: CHEST - 2 VIEW COMPARISON:  05/05/2018 FINDINGS: Heart and mediastinal contours are within normal limits. No focal opacities or effusions. No acute bony abnormality. VP shunt catheter seen in the right neck and chest. IMPRESSION: No active cardiopulmonary disease. Electronically Signed   By: Charlett Nose M.D.   On: 02/22/2022 22:22   CT Head Wo Contrast  Result Date: 02/22/2022 CLINICAL DATA:  Initial evaluation for mental status change, unknown cause. Gait disturbance. EXAM: CT HEAD WITHOUT CONTRAST TECHNIQUE: Contiguous axial images were obtained from the base of the skull through the vertex without intravenous contrast. RADIATION DOSE REDUCTION: This exam was performed according to the departmental dose-optimization program which includes automated exposure control, adjustment of the mA and/or kV according to patient size  and/or use of iterative reconstruction technique. COMPARISON:  Comparison made with most recent CT from 05/05/2018. 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 Right parietal approach VP shunt catheter in place with tip terminating near the falx, stable. Large right paramedian mono ventricle is not significantly changed in size or morphology. 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 Appearance is relatively stable from prior. No acute intracranial hemorrhage. No visible cortically based infarct. No appreciable  mass lesion, mass effect, or new midline shift. Vascular: No abnormal hyperdense vessel. Skull: Scalp soft tissues demonstrate no acute finding. Right parietal burr hole craniotomy with VP shunt catheter in place. Diffuse thickening of the diploic space noted about the calvarium. Sinuses/Orbits: Globes and orbital soft tissues demonstrate no acute finding. Scattered mucosal thickening noted about the ethmoidal air cells and maxillary sinuses. Mastoid air cells and middle ear cavities are well pneumatized and free of fluid. Other: None. IMPRESSION: 1. No acute intracranial abnormality. 2. 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 3. Right parietal approach VP shunt catheter in place with stable size and morphology of monoventricle. Electronically Signed   By: Rise Mu M.D.   On: 02/22/2022 23:01    Procedures Procedures    Medications Ordered in ED Medications  sodium chloride 0.9 % bolus 1,000 mL (1,000 mLs Intravenous New Bag/Given 02/22/22 2227)    ED Course/ Medical Decision Making/ A&P                           Medical Decision Making Amount and/or Complexity of Data Reviewed Labs: ordered. Radiology: ordered.   Patient is a 21 year old male who presents with generalized weakness and nasal  congestion.  He is awake and alert and interactive.  He does not have a documented fever here in the ED.  No focal neurologic deficits.  He had some mild tachycardia on arrival but otherwise his vital signs are within normal limits.  His COVID/flu test is negative.  Head CT shows no increasing hydrocephalus or other acute change.  Chest x-ray was interpreted by me to show no acute abnormalities.  No evidence of pneumonia.  His CBC does show an elevated WBC count.  Electrolytes are nonconcerning.  He was given a liter of IV fluids.  Will try to get a urine sample.  Will need to ambulate.  If doing better, can likely go home.  Given his other symptoms, it is likely a viral syndome.  Care turned over to Dr Read Drivers.  Final Clinical Impression(s) / ED Diagnoses Final diagnoses:  None    Rx / DC Orders ED Discharge Orders     None         Rolan Bucco, MD 02/22/22 2324

## 2022-02-22 NOTE — ED Provider Notes (Signed)
11:45 PM ?Patient ambulating at baseline.  Presentation is consistent with a viral illness. ? ?  ?Jakelyn Squyres, MD ?02/22/22 2345 ? ?

## 2022-02-23 NOTE — ED Notes (Signed)
Patient discharged to home.  All discharge instructions reviewed.  Guardian verbalized understanding via teachback method.  VS WDL.  Respirations even and unlabored.  Ambulatory out of ED.   

## 2022-03-18 ENCOUNTER — Ambulatory Visit (INDEPENDENT_AMBULATORY_CARE_PROVIDER_SITE_OTHER): Payer: Medicare Other | Admitting: Family Medicine

## 2022-03-18 VITALS — BP 102/70 | HR 76 | Temp 98.5°F | Ht 64.0 in | Wt 95.0 lb

## 2022-03-18 DIAGNOSIS — J309 Allergic rhinitis, unspecified: Secondary | ICD-10-CM

## 2022-03-18 DIAGNOSIS — J3489 Other specified disorders of nose and nasal sinuses: Secondary | ICD-10-CM | POA: Diagnosis present

## 2022-03-18 MED ORDER — FLUTICASONE PROPIONATE 50 MCG/ACT NA SUSP
2.0000 | Freq: Every day | NASAL | 3 refills | Status: DC
Start: 2022-03-18 — End: 2022-06-16

## 2022-03-18 NOTE — Progress Notes (Signed)
? ? ?  SUBJECTIVE:  ? ?CHIEF COMPLAINT / HPI:  ? ?Runny Nose ?History provided by caregiver, who reports patient was sick with viral illness ~2-3 weeks ago. He was [facility] at [facility] on 3/13 and diagnosed with human metapneumovirus. Due to his complex past medical history, they did thorough workup including CT head and shunt study at that time. ? ?Caregiver reports he is significantly improved and back to his normal baseline. However, he has a persistent runny nose and school has been sending him home everyday because of this. Caregiver requesting a note for school stating he is able to attend despite his runny nose. ?No fever, sore throat, or GI symptoms. Rare cough. ? ?He does have a history of allergies and takes Cetirizine daily.  ? ?PERTINENT  PMH / PSH: cerebral palsy, congenital hydrocephalus s/p VP shunt, intellectual disability, blindness, thyroiditis, seizure disorder ? ?OBJECTIVE:  ? ?BP 102/70   Pulse 76   Temp 98.5 ?F (36.9 ?C) (Oral)   Ht 5\' 4"  (1.626 m)   Wt 95 lb (43.1 kg)   SpO2 98%   BMI 16.31 kg/m?   ?Gen: alert, NAD ?Eyes: normal sclera and conjunctiva ?Ears: TM normal bilaterally ?Nose: boggy nasal turbinates bilaterally, clear drainage noted ?Oropharynx: normal, no erythema, edema or exudate ?Resp: normal effort, lungs CTAB ? ?ASSESSMENT/PLAN:  ? ?Rhinorrhea ?Allergic Rhinitis ?Patient with runny nose over past ~2 weeks. No red flags (no fever, no significant respiratory symptoms, no GI symptoms). Exam remarkable for clear nasal drainage and boggy turbinates.  ?Presentation consistent with allergic rhinitis with also likely component of lingering symptoms from recent metapneumovirus infection. ?-Patient given note stating he is okay to attend school ?-Continue Cetirizine, start Flonase daily ?-Return precautions reviewed with caregiver ? ? ? , MD ?[facility]  ?

## 2022-03-18 NOTE — Patient Instructions (Signed)
It was great to see you! ? ?Aaron Heath's runny nose may be related to allergies or he may still be recovering from his recent viral illness (metapneumovirus).  ? ?Continue the cetirizine for allergies and start using flonase nasal spray as needed as well. If he develops fever, difficulty breathing, or other new symptoms please let us know.  ? ?Otherwise he is fine to go to school and I have written a note stating this.  ? ?Take care and seek immediate care sooner if you develop any concerns. ? ?Dr. Anner Crete ?[facility]  ?

## 2022-03-21 ENCOUNTER — Other Ambulatory Visit: Payer: Self-pay | Admitting: Family Medicine

## 2022-04-02 ENCOUNTER — Encounter: Payer: Self-pay | Admitting: Family Medicine

## 2022-04-02 ENCOUNTER — Ambulatory Visit (INDEPENDENT_AMBULATORY_CARE_PROVIDER_SITE_OTHER): Payer: Medicare Other | Admitting: Family Medicine

## 2022-04-02 VITALS — BP 95/60 | HR 61 | Temp 97.9°F | Ht 64.0 in | Wt 94.6 lb

## 2022-04-02 DIAGNOSIS — J3489 Other specified disorders of nose and nasal sinuses: Secondary | ICD-10-CM

## 2022-04-02 NOTE — Patient Instructions (Addendum)
It was great seeing you today! ? ?You came in for runny nose but I am glad to see it is getting better! You can continue the zyrtec and flonase daily and use Robitussin as needed. If you start to develop fevers or any new and concerning symptoms please return to be seen.  ? ?Feel free to call with any questions or concerns at any time, at [phone]. ?  ?Take care,  ?Dr. Cora Collum ?[facility]  ?

## 2022-04-02 NOTE — Progress Notes (Signed)
    SUBJECTIVE:   CHIEF COMPLAINT / HPI:   Patient was seen on 4/4 for runny nose likely due to allergies as well as recent viral illness about 2 to 3 weeks prior.  Was continued on cetirizine and was advised to start Flonase daily, with return precautions provided.  Runny nose occurred after viral infection. Will not blow nose unles prompted. Has tried using Flonase twice daily. Denies fevers, minimal coughing from the mucous. No vomiting . Has tried honey robitussin. Tried Mucinex DM during viral illness without much relief Currently eating and drinking well. Today caregiver does note improvement in his runny nose.   PERTINENT  PMH / PSH:  cerebral palsy, congenital hydrocephalus s/p VP shunt, intellectual disability, blindness, thyroiditis, seizure disorder  OBJECTIVE:   BP 95/60   Pulse 61   Temp 97.9 F (36.6 C)   Ht 5\' 4"  (1.626 m)   Wt 94 lb 9.6 oz (42.9 kg)   SpO2 98%   BMI 16.24 kg/m    Physical exam General: well appearing, NAD HEENT: Nasals dry with crusting Cardiovascular: RRR, no murmurs Lungs: CTAB. Normal WOB Abdomen: soft, non-distended, non-tender Skin: warm, dry. No edema  ASSESSMENT/PLAN:   No problem-specific Assessment & Plan notes found for this encounter.   Rhinorrhea, resolved  Patient has had several weeks of rhinorrhea after a viral illness and without other symptoms. On exam no nasal discharge appreciated and nasals were very dry and with crusting. Caregiver did note improvement today. Rhinorrhea has likely been due to recovering from virus and seasonal allergies. She has been giving him daily zyrtec and flonase. I discussed continuing that regimen as there is nothing different I recommend today. Strict return precautions provided.   , DO [facility]

## 2022-04-21 ENCOUNTER — Other Ambulatory Visit: Payer: Self-pay | Admitting: Family Medicine

## 2022-04-21 DIAGNOSIS — E063 Autoimmune thyroiditis: Secondary | ICD-10-CM

## 2022-04-29 ENCOUNTER — Other Ambulatory Visit: Payer: Self-pay

## 2022-04-29 ENCOUNTER — Ambulatory Visit
Admission: EM | Admit: 2022-04-29 | Discharge: 2022-04-29 | Disposition: A | Discharge status: Home / Self Care | Payer: Medicare Other | Attending: Emergency Medicine | Admitting: Emergency Medicine

## 2022-04-29 ENCOUNTER — Encounter: Payer: Self-pay | Admitting: Emergency Medicine

## 2022-04-29 DIAGNOSIS — S0101XA Laceration without foreign body of scalp, initial encounter: Secondary | ICD-10-CM | POA: Diagnosis not present

## 2022-04-29 DIAGNOSIS — S61012A Laceration without foreign body of left thumb without damage to nail, initial encounter: Secondary | ICD-10-CM

## 2022-04-29 MED ORDER — MUPIROCIN 2 % EX OINT: 1.0000 "application " | TOPICAL_OINTMENT | Freq: Two times a day (BID) | CUTANEOUS | 0 refills | Status: DC | PRN

## 2022-04-29 NOTE — ED Triage Notes (Signed)
Per caregiver pt with scab to left side of his head that he picked at and now has some bleeding; pt also had some  self injury yesterday at school and has a injury to his right index finger; no bleeding noted  ?

## 2022-04-29 NOTE — Discharge Instructions (Signed)
Please use ointment with each dressing change. Try to keep the wound clean. ? ?Please return to the urgent care or emergency department if symptoms worsen or do not improve. ?
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
## 2022-05-19 ENCOUNTER — Other Ambulatory Visit: Payer: Self-pay | Admitting: Family Medicine

## 2022-05-21 ENCOUNTER — Other Ambulatory Visit: Payer: Self-pay | Admitting: Family Medicine

## 2022-05-21 DIAGNOSIS — R627 Adult failure to thrive: Secondary | ICD-10-CM

## 2022-06-14 ENCOUNTER — Other Ambulatory Visit: Payer: Self-pay | Admitting: Family Medicine

## 2022-06-14 DIAGNOSIS — J309 Allergic rhinitis, unspecified: Secondary | ICD-10-CM

## 2022-07-21 ENCOUNTER — Other Ambulatory Visit: Payer: Self-pay | Admitting: Family Medicine

## 2022-07-21 DIAGNOSIS — J309 Allergic rhinitis, unspecified: Secondary | ICD-10-CM

## 2022-08-19 ENCOUNTER — Other Ambulatory Visit: Payer: Self-pay | Admitting: Family Medicine

## 2022-09-15 ENCOUNTER — Other Ambulatory Visit: Payer: Self-pay | Admitting: Family Medicine

## 2022-09-15 DIAGNOSIS — J309 Allergic rhinitis, unspecified: Secondary | ICD-10-CM

## 2022-09-15 DIAGNOSIS — J302 Other seasonal allergic rhinitis: Secondary | ICD-10-CM

## 2022-09-15 DIAGNOSIS — E063 Autoimmune thyroiditis: Secondary | ICD-10-CM

## 2022-09-23 ENCOUNTER — Encounter: Payer: Medicare Other | Admitting: Family Medicine

## 2022-09-29 ENCOUNTER — Ambulatory Visit (INDEPENDENT_AMBULATORY_CARE_PROVIDER_SITE_OTHER): Payer: Medicare Other | Admitting: Family Medicine

## 2022-09-29 VITALS — BP 100/70 | HR 69 | Temp 98.8°F | Ht 64.0 in | Wt 100.0 lb

## 2022-09-29 DIAGNOSIS — Z23 Encounter for immunization: Secondary | ICD-10-CM | POA: Diagnosis present

## 2022-09-29 DIAGNOSIS — Z0000 Encounter for general adult medical examination without abnormal findings: Secondary | ICD-10-CM | POA: Diagnosis not present

## 2022-09-29 NOTE — Patient Instructions (Signed)
It was great seeing you today!  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  You also received your flu vaccine.   Feel free to call with any questions or concerns at any time, at [phone].   Take care,  Dr. Shary Key [facility]

## 2022-09-29 NOTE — Progress Notes (Unsigned)
    SUBJECTIVE:   Chief compliant/HPI: annual examination  Aaron Heath is a 21 y.o. who presents today with caregiver for an annual exam.   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 No edema    ASSESSMENT/PLAN:   No problem-specific Assessment & Plan notes found for this encounter.    Annual Examination  See AVS for age appropriate recommendations  PHQ score , reviewed and discussed.  Blood pressure reviewed and at goal.     Considered the following items based upon USPSTF recommendations: HIV testing: not indicated Hepatitis C: not indicated Hepatitis B: not indicated Syphilis if at high risk: {not indicated GC/CTnot indicated Lipid panel (nonfasting or fasting) discussed based upon AHA recommendations and not ordered.  Consider repeat every 4-6 years.  Reviewed risk factors for latent tuberculosis and not indicated Immunizations flu vaccine administered    Special Olympics form completed and given to patient at visit   Follow up in 1 year or sooner if indicated.    [place]

## 2022-10-16 ENCOUNTER — Other Ambulatory Visit: Payer: Self-pay | Admitting: Family Medicine

## 2022-10-16 DIAGNOSIS — E063 Autoimmune thyroiditis: Secondary | ICD-10-CM

## 2022-10-16 DIAGNOSIS — J302 Other seasonal allergic rhinitis: Secondary | ICD-10-CM

## 2022-12-18 ENCOUNTER — Other Ambulatory Visit: Payer: Self-pay | Admitting: Family Medicine

## 2023-01-16 ENCOUNTER — Other Ambulatory Visit: Payer: Self-pay | Admitting: *Deleted

## 2023-01-16 DIAGNOSIS — Z68.41 Body mass index (BMI) pediatric, less than 5th percentile for age: Secondary | ICD-10-CM

## 2023-01-16 MED ORDER — ENSURE ENLIVE PO LIQD: 237.0000 mL | Freq: Two times a day (BID) | ORAL | 12 refills | Status: DC

## 2023-01-16 NOTE — Telephone Encounter (Signed)
Received voicemail from cedric with patient's care team and he is needing a refill on his ensure. Order pended and sending to provider. Opel Lejeune,CMA

## 2023-02-06 ENCOUNTER — Ambulatory Visit (INDEPENDENT_AMBULATORY_CARE_PROVIDER_SITE_OTHER): Payer: Medicare Other

## 2023-02-06 DIAGNOSIS — Z0000 Encounter for general adult medical examination without abnormal findings: Secondary | ICD-10-CM

## 2023-02-06 NOTE — Patient Instructions (Signed)
Health Maintenance, Male Adopting a healthy lifestyle and getting preventive care are important in promoting health and wellness. Ask your health care provider about: The right schedule for you to have regular tests and exams. Things you can do on your own to prevent diseases and keep yourself healthy. What should I know about diet, weight, and exercise? Eat a healthy diet  Eat a diet that includes plenty of vegetables, fruits, low-fat dairy products, and lean protein. Do not eat a lot of foods that are high in solid fats, added sugars, or sodium. Maintain a healthy weight Body mass index (BMI) is a measurement that can be used to identify possible weight problems. It estimates body fat based on height and weight. Your health care provider can help determine your BMI and help you achieve or maintain a healthy weight. Get regular exercise Get regular exercise. This is one of the most important things you can do for your health. Most adults should: Exercise for at least 150 minutes each week. The exercise should increase your heart rate and make you sweat (moderate-intensity exercise). Do strengthening exercises at least twice a week. This is in addition to the moderate-intensity exercise. Spend less time sitting. Even light physical activity can be beneficial. Watch cholesterol and blood lipids Have your blood tested for lipids and cholesterol at 22 years of age, then have this test every 5 years. You may need to have your cholesterol levels checked more often if: Your lipid or cholesterol levels are high. You are older than 22 years of age. You are at high risk for heart disease. What should I know about cancer screening? Many types of cancers can be detected early and may often be prevented. Depending on your health history and family history, you may need to have cancer screening at various ages. This may include screening for: Colorectal cancer. Prostate cancer. Skin cancer. Lung  cancer. What should I know about heart disease, diabetes, and high blood pressure? Blood pressure and heart disease High blood pressure causes heart disease and increases the risk of stroke. This is more likely to develop in people who have high blood pressure readings or are overweight. Talk with your health care provider about your target blood pressure readings. Have your blood pressure checked: Every 3-5 years if you are 18-39 years of age. Every year if you are 40 years old or older. If you are between the ages of 65 and 75 and are a current or former smoker, ask your health care provider if you should have a one-time screening for abdominal aortic aneurysm (AAA). Diabetes Have regular diabetes screenings. This checks your fasting blood sugar level. Have the screening done: Once every three years after age 45 if you are at a normal weight and have a low risk for diabetes. More often and at a younger age if you are overweight or have a high risk for diabetes. What should I know about preventing infection? Hepatitis B If you have a higher risk for hepatitis B, you should be screened for this virus. Talk with your health care provider to find out if you are at risk for hepatitis B infection. Hepatitis C Blood testing is recommended for: Everyone born from 1945 through 1965. Anyone with known risk factors for hepatitis C. Sexually transmitted infections (STIs) You should be screened each year for STIs, including gonorrhea and chlamydia, if: You are sexually active and are younger than 22 years of age. You are older than 22 years of age and your   health care provider tells you that you are at risk for this type of infection. Your sexual activity has changed since you were last screened, and you are at increased risk for chlamydia or gonorrhea. Ask your health care provider if you are at risk. Ask your health care provider about whether you are at high risk for HIV. Your health care provider  may recommend a prescription medicine to help prevent HIV infection. If you choose to take medicine to prevent HIV, you should first get tested for HIV. You should then be tested every 3 months for as long as you are taking the medicine. Follow these instructions at home: Alcohol use Do not drink alcohol if your health care provider tells you not to drink. If you drink alcohol: Limit how much you have to 0-2 drinks a day. Know how much alcohol is in your drink. In the [place] one drink equals one 12 oz bottle of beer (355 mL), one 5 oz glass of wine (148 mL), or one 1 oz glass of hard liquor (44 mL). Lifestyle Do not use any products that contain nicotine or tobacco. These products include cigarettes, chewing tobacco, and vaping devices, such as e-cigarettes. If you need help quitting, ask your health care provider. Do not use street drugs. Do not share needles. Ask your health care provider for help if you need support or information about quitting drugs. General instructions Schedule regular health, dental, and eye exams. Stay current with your vaccines. Tell your health care provider if: You often feel depressed. You have ever been abused or do not feel safe at home. Summary Adopting a healthy lifestyle and getting preventive care are important in promoting health and wellness. Follow your health care provider's instructions about healthy diet, exercising, and getting tested or screened for diseases. Follow your health care provider's instructions on monitoring your cholesterol and blood pressure. This information is not intended to replace advice given to you by your health care provider. Make sure you discuss any questions you have with your health care provider. Document Revised: 04/22/2021 Document Reviewed: 04/22/2021 Elsevier Patient Education  2023 [employer]  

## 2023-02-06 NOTE — Progress Notes (Signed)
I connected with  Aaron Heath (caregiver) on 02/06/23 by a audio enabled telemedicine application and verified that I am speaking with the correct person using two identifiers.  Patient Location: Home  Provider Location: Home Office  I discussed the limitations of evaluation and management by telemedicine. The patient expressed understanding and agreed to proceed.  Subjective:   Aaron Heath is a 22 y.o. male who presents for an Initial Medicare Annual Wellness Visit.  Review of Systems    Per HPI unless specifically indicated below.  Cardiac Risk Factors include: male gender, and Hypertension.          Objective:       09/29/2022    4:57 PM 04/29/2022    9:10 AM 04/02/2022    3:50 PM  Vitals with BMI  Height '5\' 4"'$   '5\' 4"'$   Weight 100 lbs  94 lbs 10 oz  BMI [id number]  [id number]  Systolic [id number]  Diastolic 70 66 60  Pulse 69 59 61    There were no vitals filed for this visit. There is no height or weight on file to calculate BMI.     03/18/2022    9:27 AM 02/22/2022    8:58 PM 10/17/2020    8:21 AM 10/04/2018    2:33 PM 02/11/2013   12:20 PM 09/08/2012    9:20 AM  Advanced Directives  Does Patient Have a Medical Advance Directive? No No No Yes Not applicable, patient [id number] years old Patient does not have advance directive;Patient would not like information  Would patient like information on creating a medical advance directive? No - Patient declined No - Patient declined No - Patient declined     Pre-existing out of facility DNR order (yellow form or pink MOST form)      No    Current Medications (verified) Outpatient Encounter Medications as of 02/06/2023  Medication Sig   ARIPiprazole (ABILIFY) 10 MG tablet Take 15 mg by mouth in the morning and at bedtime.    cetirizine (ZYRTEC) 10 MG tablet TAKE 1 TABLET BY MOUTH (=TO '10MG'$ ) ONCE DAILY.   cholecalciferol (VITAMIN D) 25 MCG (1000 UNIT) tablet Take 1 tablet (1,000 Units total) by mouth daily.    divalproex (DEPAKOTE) 500 MG DR tablet Take 500 mg by mouth in the morning and at bedtime.   feeding supplement (ENSURE ENLIVE / ENSURE PLUS) LIQD Take 237 mLs by mouth 2 (two) times daily between meals.   fluticasone (FLONASE) 50 MCG/ACT nasal spray PLACE 2 SPRAYS INTO BOTH NOSTRILS DAILY.   guanFACINE (TENEX) 1 MG tablet Take 1 tablet (1 mg total) by mouth at bedtime.   hydrOXYzine (ATARAX/VISTARIL) 25 MG tablet Take 25 mg by mouth 3 (three) times daily as needed. Twice daily and once as needed   levothyroxine (SYNTHROID) 50 MCG tablet TAKE 1 TABLET BY MOUTH ONCE DAILY BEFORE BREAKFAST.   melatonin 3 MG TABS tablet Take 1 tablet (3 mg total) by mouth at bedtime.   multivitamin-iron-minerals-folic acid (CENTRUM) chewable tablet Chew 1 tablet by mouth daily.   Pediatric Multiple Vitamins (ANIMAL SHAPES) CHEW TAKE 1 VITAMIN BY MOUTH DAILY.   polyethylene glycol (MIRALAX / GLYCOLAX) 17 g packet MIX 17 GRAMS IN LIQUID AND DRINK ONCE DAILY.   No facility-administered encounter medications on file as of 02/06/2023.    Allergies (verified) Patient has no known allergies.   History: Past Medical History:  Diagnosis Date   ADHD (attention deficit hyperactivity disorder)  Asthma    Blindness, cortical BOTH EYES   Chiari malformation    Cognitive deficits COGNITIVE LEVE AGE 22   Congenital hydrocephalus (HCC)    CP (cerebral palsy) (HCC) MILD FORM   CURRENTLY TIOLET TRAINING   Fetal alcohol syndrome    Generally unsteady    Hypoglycemia 02/14/2013   Hypotension, unspecified 02/11/2013   Optic nerve atrophy    Oral motor dysfunction OCCASIONALLY WHEN HAS TO MUCH FOOD IN MOUTH- HAS TO BE REMINDED TO SWALLOW   Pedal edema 02/14/2013   Risk for falls DUE TO CP-- WEARS HELMET   S/P VP shunt    Seizures ([place]) LAST SEIZURE 2010   NEUROLOGIST- DR Consuela Mimes- LOV 08-23-2012  AT [facility]   Sepsis ([place]) 02/11/2013   Past Surgical History:  Procedure Laterality Date   BILATERAL EYE  LATERAL RECTUS MUSCLE  RECESSION AND INFERIOR OBLIQUE MUSCLE RECESSION  11-30-2003  DR Gwyndolyn Saxon YOUNG   V-PATTERN EXOTROPIA   CSF SHUNT  AT BIRTH   HERNIA REPAIR  DATE UNKNOWN   MEDIAN RECTUS REPAIR  09/08/2012   Procedure: MEDIAN RECTUS REPAIR;  Surgeon: Dara Hoyer, MD;  Location: [facility];  Service: Ophthalmology;  Laterality: Bilateral;  inferior oblique myectomy  lateral rectus resection     No family history on file. Social History   Socioeconomic History   Marital status: Single    Spouse name: Not on file   Number of children: Not on file   Years of education: Not on file   Highest education level: Not on file  Occupational History   Not on file  Tobacco Use   Smoking status: Never    Passive exposure: Yes   Smokeless tobacco: Never  Vaping Use   Vaping Use: Never used  Substance and Sexual Activity   Alcohol use: No   Drug use: No   Sexual activity: Not on file  Other Topics Concern   Not on file  Social History Narrative   Has been placed in a group home 6/15. Biological sister is still with foster family.    Social Determinants of Health   Financial Resource Strain: Low Risk  (02/06/2023)   Overall Financial Resource Strain (CARDIA)    Difficulty of Paying Living Expenses: Not hard at all  Food Insecurity: No Food Insecurity (02/06/2023)   Hunger Vital Sign    Worried About Running Out of Food in the Last Year: Never true    Ran Out of Food in the Last Year: Never true  Transportation Needs: Not on file  Physical Activity: Insufficiently Active (02/06/2023)   Exercise Vital Sign    Days of Exercise per Week: 7 days    Minutes of Exercise per Session: 10 min  Stress: No Stress Concern Present (02/06/2023)   [place]    Feeling of Stress : Not at all  Social Connections: Socially Isolated (02/06/2023)   Social Connection and Isolation Panel [NHANES]    Frequency of Communication with Friends  and Family: Never    Frequency of Social Gatherings with Friends and Family: Never    Attends Religious Services: Never    Marine scientist or Organizations: No    Attends Music therapist: Never    Marital Status: Never married    Tobacco Counseling Counseling given: Not Answered   Clinical Intake:  Pre-visit preparation completed: No  Pain : No/denies pain     Nutritional Status: BMI of 19-24  Normal  Diabetes: No     Diabetic?No         Activities of Daily Living    02/06/2023    3:19 PM  In your present state of health, do you have any difficulty performing the following activities:  Hearing? 0  Vision? 1  Difficulty concentrating or making decisions? 1  Walking or climbing stairs? 0  Dressing or bathing? 1  Doing errands, shopping? 0    Patient Care Team: Shary Key, DO as PCP - General (Family Medicine)  Indicate any recent Medical Services you may have received from other than [facility] providers in the past year (date may be approximate).     Assessment:   This is a routine wellness examination for Aaron Heath.  Hearing/Vision screen Denies any hearing issues. Vision:education level around 22 years of age, Seizure disorder, Cortical blindness/esotropia/optic nerve atrophy-s/p eye surgery in 2013 with improved vision documented.  Dietary issues and exercise activities discussed: Current Exercise Habits: Structured exercise class, Type of exercise: stretching;walking, Time (Minutes): 15, Frequency (Times/Week): 7, Weekly Exercise (Minutes/Week): 105, Intensity: Mild, Exercise limited by: neurologic condition(s);Other - see comments   Goals Addressed   None    Depression Screen    02/06/2023    3:19 PM 09/29/2022    4:59 PM 04/02/2022    4:24 PM 03/18/2022    9:27 AM 07/03/2021    8:50 AM 04/08/2021    4:27 PM 04/08/2021    3:35 PM  PHQ 2/9 Scores  PHQ - 2 Score 0 0 0  0 0 0  PHQ- 9 Score  0 0  0 4 0  Exception  Documentation    Other- indicate reason in comment box     Not completed    pt is delayed       Fall Risk    02/06/2023    3:19 PM 10/17/2020    8:22 AM  [place] in the past year? 0 0  Number falls in past yr: 0 0  Injury with Fall? 0   Risk for fall due to : No Fall Risks   Follow up Falls evaluation completed     [place]:  Any stairs in or around the home? Yes  If so, are there any without handrails? No  Home free of loose throw rugs in walkways, pet beds, electrical cords, etc? Yes  Adequate lighting in your home to reduce risk of falls? Yes   ASSISTIVE DEVICES UTILIZED TO PREVENT FALLS:  Life alert? No  Use of a cane, walker or w/c? No  Grab bars in the bathroom? No  Shower chair or bench in shower? No  Elevated toilet seat or a handicapped toilet? No   TIMED UP AND GO:  Was the test performed?Unable to perform, virtual appointment   Cognitive Function:  Unable to assess, moderate mental disability, education level around 22 years of age documented.    Immunizations Immunization History  Administered Date(s) Administered   Influenza,inj,Quad PF,6+ Mos 11/21/2020, 10/18/2021, 09/29/2022   PFIZER(Purple Top)SARS-COV-2 Vaccination 02/23/2020, 03/21/2020   Pfizer Covid-19 Vaccine Bivalent Booster 58yr & up 10/18/2021    TDAP status: Due, Education has been provided regarding the importance of this vaccine. Advised may receive this vaccine at local pharmacy or Health Dept. Aware to provide a copy of the vaccination record if obtained from local pharmacy or Health Dept. Verbalized acceptance and understanding.  Flu Vaccine status: Up to date  Pneumococcal vaccine: not applicable  Covid-19 vaccine status: Information provided on how to obtain vaccines.   Qualifies for Shingles Vaccine? No   Screening Tests Health Maintenance  Topic Date Due   HPV VACCINES (1 - Male 2-dose series) Never done   DTaP/Tdap/Td (1 - Tdap)  Never done   COVID-19 Vaccine (4 - 2023-24 season) 08/15/2022   Medicare Annual Wellness (AWV)  02/07/2024   INFLUENZA VACCINE  Completed   Hepatitis C Screening  Completed   HIV Screening  Completed    Health Maintenance  Health Maintenance Due  Topic Date Due   HPV VACCINES (1 - Male 2-dose series) Never done   DTaP/Tdap/Td (1 - Tdap) Never done   COVID-19 Vaccine (4 - 2023-24 season) 08/15/2022    Colorectal Screening: Not applicable   Lung Cancer Screening: (Low Dose CT Chest recommended if Age 67-80 years, 30 pack-year currently smoking OR have quit w/in 15years.) does not qualify.   Lung Cancer Screening Referral:   Additional Screening:not applicable   Hepatitis C Screening: does qualify;Overdue   Vision Screening: Recommended annual ophthalmology exams for early detection of glaucoma and other disorders of the eye. Is the patient up to date with their annual eye exam?  Yes  Who is the provider or what is the name of the office in which the patient attends annual eye exams? [facility]  If pt is not established with a provider, would they like to be referred to a provider to establish care? No .   Dental Screening: Recommended annual dental exams for proper oral hygiene  Community Resource Referral / Chronic Care Management: CRR required this visit?  No   CCM required this visit?  No      Plan:     I have personally reviewed and noted the following in the patient's chart:   Medical and social history Use of alcohol, tobacco or illicit drugs  Current medications and supplements including opioid prescriptions. Patient is not currently taking opioid prescriptions. Functional ability and status Nutritional status Physical activity Advanced directives List of other physicians Hospitalizations, surgeries, and ER visits in previous 12 months Vitals Screenings to include cognitive, depression, and falls Referrals and appointments  In addition, I have  reviewed and discussed with patient certain preventive protocols, quality metrics, and best practice recommendations. A written personalized care plan for preventive services as well as general preventive health recommendations were provided to patient.    Aaron Heath , Thank you for taking time to come for your Medicare Wellness Visit. I appreciate your ongoing commitment to your health goals. Please review the following plan we discussed and let me know if I can assist you in the future.   These are the goals we discussed:  Goals   None     This is a list of the screening recommended for you and due dates:  Health Maintenance  Topic Date Due   HPV Vaccine (1 - Male 2-dose series) Never done   DTaP/Tdap/Td vaccine (1 - Tdap) Never done   COVID-19 Vaccine (4 - 2023-24 season) 08/15/2022   Medicare Annual Wellness Visit  02/07/2024   Flu Shot  Completed   Hepatitis C Screening: USPSTF Recommendation to screen - Ages 18-79 yo.  Completed   HIV Screening  Completed      Wilson Singer, [facility]   02/06/2023   Nurse Notes: Approximately 30 minute Non-Face -To-Face Medicare Wellness Visit

## 2023-03-03 ENCOUNTER — Other Ambulatory Visit: Payer: Self-pay | Admitting: Family Medicine

## 2023-03-03 DIAGNOSIS — J302 Other seasonal allergic rhinitis: Secondary | ICD-10-CM

## 2023-03-23 ENCOUNTER — Other Ambulatory Visit: Payer: Self-pay | Admitting: *Deleted

## 2023-03-23 DIAGNOSIS — R627 Adult failure to thrive: Secondary | ICD-10-CM

## 2023-03-23 MED ORDER — VITAMIN D3 25 MCG (1000 UNIT) PO TABS: 1000.0000 [IU] | ORAL_TABLET | Freq: Every day | ORAL | 3 refills | Status: DC

## 2023-03-23 MED ORDER — POLYETHYLENE GLYCOL 3350 17 G PO PACK: PACK | ORAL | 0 refills | Status: DC

## 2023-04-15 ENCOUNTER — Other Ambulatory Visit: Payer: Self-pay | Admitting: Family Medicine

## 2023-04-22 ENCOUNTER — Inpatient Hospital Stay (HOSPITAL_BASED_OUTPATIENT_CLINIC_OR_DEPARTMENT_OTHER)
Admission: EM | Admit: 2023-04-22 | Discharge: 2023-04-27 | DRG: 399 | Disposition: A | Discharge status: Home / Self Care | Payer: Medicare Other | Attending: General Surgery | Admitting: General Surgery

## 2023-04-22 ENCOUNTER — Encounter (HOSPITAL_BASED_OUTPATIENT_CLINIC_OR_DEPARTMENT_OTHER): Payer: Self-pay | Admitting: Emergency Medicine

## 2023-04-22 ENCOUNTER — Other Ambulatory Visit: Payer: Self-pay

## 2023-04-22 DIAGNOSIS — Z982 Presence of cerebrospinal fluid drainage device: Secondary | ICD-10-CM

## 2023-04-22 DIAGNOSIS — H4720 Unspecified optic atrophy: Secondary | ICD-10-CM | POA: Diagnosis present

## 2023-04-22 DIAGNOSIS — R6250 Unspecified lack of expected normal physiological development in childhood: Secondary | ICD-10-CM | POA: Diagnosis present

## 2023-04-22 DIAGNOSIS — K56600 Partial intestinal obstruction, unspecified as to cause: Secondary | ICD-10-CM

## 2023-04-22 DIAGNOSIS — K3532 Acute appendicitis with perforation and localized peritonitis, without abscess: Secondary | ICD-10-CM | POA: Diagnosis present

## 2023-04-22 DIAGNOSIS — G40909 Epilepsy, unspecified, not intractable, without status epilepticus: Secondary | ICD-10-CM | POA: Diagnosis present

## 2023-04-22 DIAGNOSIS — Z1152 Encounter for screening for COVID-19: Secondary | ICD-10-CM

## 2023-04-22 DIAGNOSIS — K3533 Acute appendicitis with perforation and localized peritonitis, with abscess: Secondary | ICD-10-CM | POA: Diagnosis not present

## 2023-04-22 DIAGNOSIS — J45909 Unspecified asthma, uncomplicated: Secondary | ICD-10-CM | POA: Diagnosis present

## 2023-04-22 DIAGNOSIS — K3580 Unspecified acute appendicitis: Principal | ICD-10-CM

## 2023-04-22 DIAGNOSIS — G809 Cerebral palsy, unspecified: Secondary | ICD-10-CM | POA: Diagnosis present

## 2023-04-22 DIAGNOSIS — R4189 Other symptoms and signs involving cognitive functions and awareness: Secondary | ICD-10-CM | POA: Diagnosis present

## 2023-04-22 LAB — RESP PANEL BY RT-PCR (RSV, FLU A&B, COVID)  RVPGX2
Influenza A by PCR: NEGATIVE
Influenza B by PCR: NEGATIVE
Resp Syncytial Virus by PCR: NEGATIVE
SARS Coronavirus 2 by RT PCR: NEGATIVE

## 2023-04-22 MED ORDER — SODIUM CHLORIDE 0.9 % IV BOLUS
500.0000 mL | Freq: Once | INTRAVENOUS | Status: AC
  Administered 2023-04-23: 500 mL via INTRAVENOUS

## 2023-04-22 MED ORDER — ONDANSETRON HCL 4 MG/2ML IJ SOLN
4.0000 mg | Freq: Once | INTRAMUSCULAR | Status: AC
  Administered 2023-04-23: 4 mg via INTRAVENOUS
  Filled 2023-04-22: qty 2

## 2023-04-22 NOTE — ED Triage Notes (Signed)
Pt's caregiver reports pt with vomiting today and diarrhea since yesterday; he has not indicated pain

## 2023-04-23 ENCOUNTER — Emergency Department (HOSPITAL_BASED_OUTPATIENT_CLINIC_OR_DEPARTMENT_OTHER): Payer: Medicare Other

## 2023-04-23 ENCOUNTER — Encounter (HOSPITAL_COMMUNITY): Payer: Self-pay

## 2023-04-23 ENCOUNTER — Other Ambulatory Visit: Payer: Self-pay

## 2023-04-23 ENCOUNTER — Emergency Department (HOSPITAL_COMMUNITY): Payer: Medicare Other | Admitting: Anesthesiology

## 2023-04-23 ENCOUNTER — Encounter (HOSPITAL_COMMUNITY): Admission: EM | Disposition: A | Discharge status: Home / Self Care | Payer: Self-pay | Source: Home / Self Care

## 2023-04-23 DIAGNOSIS — Z1152 Encounter for screening for COVID-19: Secondary | ICD-10-CM | POA: Diagnosis not present

## 2023-04-23 DIAGNOSIS — J45909 Unspecified asthma, uncomplicated: Secondary | ICD-10-CM

## 2023-04-23 DIAGNOSIS — G809 Cerebral palsy, unspecified: Secondary | ICD-10-CM | POA: Diagnosis present

## 2023-04-23 DIAGNOSIS — H4720 Unspecified optic atrophy: Secondary | ICD-10-CM | POA: Diagnosis present

## 2023-04-23 DIAGNOSIS — K3532 Acute appendicitis with perforation and localized peritonitis, without abscess: Secondary | ICD-10-CM | POA: Diagnosis present

## 2023-04-23 DIAGNOSIS — K3533 Acute appendicitis with perforation and localized peritonitis, with abscess: Secondary | ICD-10-CM | POA: Diagnosis present

## 2023-04-23 DIAGNOSIS — K37 Unspecified appendicitis: Secondary | ICD-10-CM

## 2023-04-23 DIAGNOSIS — Z982 Presence of cerebrospinal fluid drainage device: Secondary | ICD-10-CM | POA: Diagnosis not present

## 2023-04-23 DIAGNOSIS — E039 Hypothyroidism, unspecified: Secondary | ICD-10-CM | POA: Diagnosis not present

## 2023-04-23 DIAGNOSIS — R6250 Unspecified lack of expected normal physiological development in childhood: Secondary | ICD-10-CM | POA: Diagnosis present

## 2023-04-23 DIAGNOSIS — G40909 Epilepsy, unspecified, not intractable, without status epilepticus: Secondary | ICD-10-CM | POA: Diagnosis present

## 2023-04-23 DIAGNOSIS — R4189 Other symptoms and signs involving cognitive functions and awareness: Secondary | ICD-10-CM | POA: Diagnosis present

## 2023-04-23 DIAGNOSIS — K56600 Partial intestinal obstruction, unspecified as to cause: Secondary | ICD-10-CM | POA: Diagnosis present

## 2023-04-23 HISTORY — PX: LAPAROSCOPIC APPENDECTOMY: SHX408

## 2023-04-23 LAB — CBC WITH DIFFERENTIAL/PLATELET
Abs Immature Granulocytes: 0.05 10*3/uL (ref 0.00–0.07)
Basophils Absolute: 0 10*3/uL (ref 0.0–0.1)
Basophils Relative: 0 %
Eosinophils Absolute: 0 10*3/uL (ref 0.0–0.5)
Eosinophils Relative: 0 %
HCT: 44.7 % (ref 39.0–52.0)
Hemoglobin: 14.9 g/dL (ref 13.0–17.0)
Immature Granulocytes: 1 %
Lymphocytes Relative: 6 %
Lymphs Abs: 0.6 10*3/uL — ABNORMAL LOW (ref 0.7–4.0)
MCH: 29 pg (ref 26.0–34.0)
MCHC: 33.3 g/dL (ref 30.0–36.0)
MCV: 87.1 fL (ref 80.0–100.0)
Monocytes Absolute: 0.9 10*3/uL (ref 0.1–1.0)
Monocytes Relative: 9 %
Neutro Abs: 8.7 10*3/uL — ABNORMAL HIGH (ref 1.7–7.7)
Neutrophils Relative %: 84 %
Platelets: 97 10*3/uL — ABNORMAL LOW (ref 150–400)
RBC: 5.13 MIL/uL (ref 4.22–5.81)
RDW: 13.2 % (ref 11.5–15.5)
WBC: 10.3 10*3/uL (ref 4.0–10.5)
nRBC: 0 % (ref 0.0–0.2)

## 2023-04-23 LAB — COMPREHENSIVE METABOLIC PANEL
ALT: 17 U/L (ref 0–44)
AST: 24 U/L (ref 15–41)
Albumin: 3.6 g/dL (ref 3.5–5.0)
Alkaline Phosphatase: 78 U/L (ref 38–126)
Anion gap: 10 (ref 5–15)
BUN: 31 mg/dL — ABNORMAL HIGH (ref 6–20)
CO2: 30 mmol/L (ref 22–32)
Calcium: 9.6 mg/dL (ref 8.9–10.3)
Chloride: 97 mmol/L — ABNORMAL LOW (ref 98–111)
Creatinine, Ser: 0.85 mg/dL (ref 0.61–1.24)
GFR, Estimated: >60 mL/min (ref >60)
Glucose, Bld: 108 mg/dL — ABNORMAL HIGH (ref 70–99)
Potassium: 4.2 mmol/L (ref 3.5–5.1)
Sodium: 137 mmol/L (ref 135–145)
Total Bilirubin: 0.7 mg/dL (ref 0.3–1.2)
Total Protein: 8.6 g/dL — ABNORMAL HIGH (ref 6.5–8.1)

## 2023-04-23 SURGERY — APPENDECTOMY, LAPAROSCOPIC
Anesthesia: General

## 2023-04-23 MED ORDER — DIPHENHYDRAMINE HCL 12.5 MG/5ML PO ELIX
12.5000 mg | ORAL_SOLUTION | Freq: Four times a day (QID) | ORAL | Status: DC | PRN
Start: 2023-04-23 — End: 2023-04-23

## 2023-04-23 MED ORDER — BUPIVACAINE HCL (PF) 0.25 % IJ SOLN
INTRAMUSCULAR | Status: AC
  Filled 2023-04-23: qty 10

## 2023-04-23 MED ORDER — FENTANYL CITRATE (PF) 100 MCG/2ML IJ SOLN
INTRAMUSCULAR | Status: DC | PRN
  Administered 2023-04-23 (×2): 50 ug via INTRAVENOUS

## 2023-04-23 MED ORDER — LEVOTHYROXINE SODIUM 50 MCG PO TABS
50.0000 ug | ORAL_TABLET | Freq: Every day | ORAL | Status: DC
  Administered 2023-04-24 – 2023-04-27 (×4): 50 ug via ORAL
  Filled 2023-04-23 (×4): qty 1

## 2023-04-23 MED ORDER — ONDANSETRON HCL 4 MG/2ML IJ SOLN
4.0000 mg | Freq: Four times a day (QID) | INTRAMUSCULAR | Status: DC | PRN
  Administered 2023-04-24: 4 mg via INTRAVENOUS
  Filled 2023-04-23 (×3): qty 2

## 2023-04-23 MED ORDER — METRONIDAZOLE 500 MG/100ML IV SOLN
500.0000 mg | Freq: Two times a day (BID) | INTRAVENOUS | Status: DC
  Administered 2023-04-23 – 2023-04-27 (×7): 500 mg via INTRAVENOUS
  Filled 2023-04-23 (×8): qty 100

## 2023-04-23 MED ORDER — MEPERIDINE HCL 50 MG/ML IJ SOLN: 6.2500 mg | INTRAMUSCULAR | Status: DC | PRN

## 2023-04-23 MED ORDER — LACTATED RINGERS IV SOLN: INTRAVENOUS | Status: DC

## 2023-04-23 MED ORDER — SODIUM CHLORIDE 0.9 % IV SOLN
2.0000 g | Freq: Once | INTRAVENOUS | Status: AC
  Administered 2023-04-23: 2 g via INTRAVENOUS
  Filled 2023-04-23: qty 20

## 2023-04-23 MED ORDER — ORAL CARE MOUTH RINSE: 15.0000 mL | Freq: Once | OROMUCOSAL | Status: DC

## 2023-04-23 MED ORDER — 0.9 % SODIUM CHLORIDE (POUR BTL) OPTIME
TOPICAL | Status: DC | PRN
  Administered 2023-04-23: 1000 mL

## 2023-04-23 MED ORDER — MELATONIN 3 MG PO TABS
3.0000 mg | ORAL_TABLET | Freq: Every evening | ORAL | Status: DC | PRN
  Administered 2023-04-24 – 2023-04-25 (×2): 3 mg via ORAL
  Filled 2023-04-23 (×2): qty 1

## 2023-04-23 MED ORDER — OXYCODONE HCL 5 MG/5ML PO SOLN: 5.0000 mg | Freq: Once | ORAL | Status: DC | PRN

## 2023-04-23 MED ORDER — AMISULPRIDE (ANTIEMETIC) 5 MG/2ML IV SOLN: 10.0000 mg | Freq: Once | INTRAVENOUS | Status: DC | PRN

## 2023-04-23 MED ORDER — PROPOFOL 10 MG/ML IV BOLUS
INTRAVENOUS | Status: AC
  Filled 2023-04-23: qty 20

## 2023-04-23 MED ORDER — KETOROLAC TROMETHAMINE 30 MG/ML IJ SOLN: 15.0000 mg | Freq: Once | INTRAMUSCULAR | Status: DC | PRN

## 2023-04-23 MED ORDER — SUCCINYLCHOLINE CHLORIDE 200 MG/10ML IV SOSY
PREFILLED_SYRINGE | INTRAVENOUS | Status: DC | PRN
  Administered 2023-04-23: 80 mg via INTRAVENOUS

## 2023-04-23 MED ORDER — GUANFACINE HCL ER 1 MG PO TB24
2.0000 mg | ORAL_TABLET | Freq: Every day | ORAL | Status: DC
  Administered 2023-04-23 – 2023-04-26 (×4): 2 mg via ORAL
  Filled 2023-04-23 (×4): qty 2

## 2023-04-23 MED ORDER — CHLORHEXIDINE GLUCONATE 0.12 % MT SOLN: 15.0000 mL | Freq: Once | OROMUCOSAL | Status: DC

## 2023-04-23 MED ORDER — PROPOFOL 10 MG/ML IV BOLUS
INTRAVENOUS | Status: DC | PRN
  Administered 2023-04-23: 80 mg via INTRAVENOUS

## 2023-04-23 MED ORDER — GLYCOPYRROLATE 0.2 MG/ML IJ SOLN
INTRAMUSCULAR | Status: DC | PRN
  Administered 2023-04-23: .2 mg via INTRAVENOUS

## 2023-04-23 MED ORDER — FENTANYL CITRATE (PF) 100 MCG/2ML IJ SOLN
INTRAMUSCULAR | Status: AC
  Filled 2023-04-23: qty 2

## 2023-04-23 MED ORDER — OXYCODONE HCL 5 MG PO TABS: 5.0000 mg | ORAL_TABLET | Freq: Once | ORAL | Status: DC | PRN

## 2023-04-23 MED ORDER — DIVALPROEX SODIUM 500 MG PO DR TAB
500.0000 mg | DELAYED_RELEASE_TABLET | Freq: Two times a day (BID) | ORAL | Status: DC
  Administered 2023-04-23 – 2023-04-27 (×8): 500 mg via ORAL
  Filled 2023-04-23 (×2): qty 1
  Filled 2023-04-23 (×2): qty 2
  Filled 2023-04-23: qty 1
  Filled 2023-04-23: qty 2
  Filled 2023-04-23 (×3): qty 1
  Filled 2023-04-23: qty 2
  Filled 2023-04-23 (×2): qty 1
  Filled 2023-04-23 (×4): qty 2

## 2023-04-23 MED ORDER — DEXAMETHASONE SODIUM PHOSPHATE 10 MG/ML IJ SOLN
INTRAMUSCULAR | Status: AC
  Filled 2023-04-23: qty 1

## 2023-04-23 MED ORDER — HYDROMORPHONE HCL 1 MG/ML IJ SOLN: 0.2500 mg | INTRAMUSCULAR | Status: DC | PRN

## 2023-04-23 MED ORDER — ROCURONIUM BROMIDE 10 MG/ML (PF) SYRINGE
PREFILLED_SYRINGE | INTRAVENOUS | Status: DC | PRN
  Administered 2023-04-23: 30 mg via INTRAVENOUS
  Administered 2023-04-23 (×3): 10 mg via INTRAVENOUS

## 2023-04-23 MED ORDER — IOHEXOL 300 MG/ML  SOLN: 100.0000 mL | Freq: Once | INTRAMUSCULAR | Status: DC | PRN

## 2023-04-23 MED ORDER — BUPIVACAINE HCL (PF) 0.25 % IJ SOLN
INTRAMUSCULAR | Status: DC | PRN
  Administered 2023-04-23: 20 mL

## 2023-04-23 MED ORDER — ACETAMINOPHEN 500 MG PO TABS
1000.0000 mg | ORAL_TABLET | Freq: Once | ORAL | Status: DC
  Filled 2023-04-23: qty 2

## 2023-04-23 MED ORDER — DEXAMETHASONE SODIUM PHOSPHATE 10 MG/ML IJ SOLN
INTRAMUSCULAR | Status: DC | PRN
  Administered 2023-04-23: 4 mg via INTRAVENOUS

## 2023-04-23 MED ORDER — LIDOCAINE 2% (20 MG/ML) 5 ML SYRINGE
INTRAMUSCULAR | Status: DC | PRN
  Administered 2023-04-23: 40 mg via INTRAVENOUS

## 2023-04-23 MED ORDER — ONDANSETRON HCL 4 MG/2ML IJ SOLN
INTRAMUSCULAR | Status: DC | PRN
  Administered 2023-04-23: 4 mg via INTRAVENOUS

## 2023-04-23 MED ORDER — ONDANSETRON HCL 4 MG/2ML IJ SOLN: 4.0000 mg | Freq: Once | INTRAMUSCULAR | Status: DC | PRN

## 2023-04-23 MED ORDER — OXYCODONE HCL 5 MG PO TABS
5.0000 mg | ORAL_TABLET | ORAL | Status: DC | PRN
  Administered 2023-04-23 – 2023-04-26 (×8): 5 mg via ORAL
  Filled 2023-04-23 (×9): qty 1

## 2023-04-23 MED ORDER — METHOCARBAMOL 500 MG PO TABS
500.0000 mg | ORAL_TABLET | Freq: Three times a day (TID) | ORAL | Status: DC
  Administered 2023-04-23 – 2023-04-27 (×12): 500 mg via ORAL
  Filled 2023-04-23 (×12): qty 1

## 2023-04-23 MED ORDER — MORPHINE SULFATE (PF) 2 MG/ML IV SOLN
2.0000 mg | INTRAVENOUS | Status: DC | PRN
  Administered 2023-04-24: 2 mg via INTRAVENOUS
  Filled 2023-04-23: qty 1

## 2023-04-23 MED ORDER — ONDANSETRON HCL 4 MG/2ML IJ SOLN
INTRAMUSCULAR | Status: AC
  Filled 2023-04-23: qty 2

## 2023-04-23 MED ORDER — HYDROXYZINE HCL 25 MG PO TABS
25.0000 mg | ORAL_TABLET | Freq: Three times a day (TID) | ORAL | Status: DC | PRN
  Administered 2023-04-24 – 2023-04-25 (×2): 25 mg via ORAL
  Filled 2023-04-23 (×2): qty 1

## 2023-04-23 MED ORDER — SUGAMMADEX SODIUM 200 MG/2ML IV SOLN
INTRAVENOUS | Status: DC | PRN
  Administered 2023-04-23: 100 mg via INTRAVENOUS

## 2023-04-23 MED ORDER — ARIPIPRAZOLE 5 MG PO TABS
7.5000 mg | ORAL_TABLET | Freq: Two times a day (BID) | ORAL | Status: DC
  Administered 2023-04-23 – 2023-04-27 (×8): 7.5 mg via ORAL
  Filled 2023-04-23 (×8): qty 2

## 2023-04-23 MED ORDER — DIPHENHYDRAMINE HCL 50 MG/ML IJ SOLN: 12.5000 mg | Freq: Four times a day (QID) | INTRAMUSCULAR | Status: DC | PRN

## 2023-04-23 MED ORDER — ACETAMINOPHEN 325 MG PO TABS
650.0000 mg | ORAL_TABLET | Freq: Four times a day (QID) | ORAL | Status: DC
  Administered 2023-04-23 – 2023-04-27 (×13): 650 mg via ORAL
  Filled 2023-04-23 (×14): qty 2

## 2023-04-23 MED ORDER — SODIUM CHLORIDE 0.9 % IV SOLN
2.0000 g | INTRAVENOUS | Status: AC
  Administered 2023-04-24 – 2023-04-26 (×3): 2 g via INTRAVENOUS
  Filled 2023-04-23 (×4): qty 20

## 2023-04-23 MED ORDER — LACTATED RINGERS IR SOLN
Status: DC | PRN
  Administered 2023-04-23: 1000 mL

## 2023-04-23 MED ORDER — LIDOCAINE HCL (PF) 2 % IJ SOLN
INTRAMUSCULAR | Status: AC
  Filled 2023-04-23: qty 5

## 2023-04-23 MED ORDER — DEXMEDETOMIDINE HCL IN NACL 80 MCG/20ML IV SOLN
INTRAVENOUS | Status: DC | PRN
  Administered 2023-04-23 (×2): 8 ug via INTRAVENOUS
  Administered 2023-04-23: 4 ug via INTRAVENOUS

## 2023-04-23 MED ORDER — METRONIDAZOLE 500 MG/100ML IV SOLN
500.0000 mg | Freq: Once | INTRAVENOUS | Status: AC
  Administered 2023-04-23: 500 mg via INTRAVENOUS
  Filled 2023-04-23: qty 100

## 2023-04-23 SURGICAL SUPPLY — 59 items
ADH SKN CLS APL DERMABOND .7 (GAUZE/BANDAGES/DRESSINGS) ×1
APL PRP STRL LF DISP 70% ISPRP (MISCELLANEOUS) ×1
APPLIER CLIP 5 13 M/L LIGAMAX5 (MISCELLANEOUS) ×1
APPLIER CLIP ROT 10 11.4 M/L (STAPLE)
APR CLP MED LRG 11.4X10 (STAPLE)
APR CLP MED LRG 5 ANG JAW (MISCELLANEOUS) ×1
BAG COUNTER SPONGE SURGICOUNT (BAG) IMPLANT
BAG SPNG CNTER NS LX DISP (BAG)
CABLE HIGH FREQUENCY MONO STRZ (ELECTRODE) ×1 IMPLANT
CHLORAPREP W/TINT 26 (MISCELLANEOUS) ×1 IMPLANT
CLIP APPLIE 5 13 M/L LIGAMAX5 (MISCELLANEOUS) IMPLANT
CLIP APPLIE ROT 10 11.4 M/L (STAPLE) IMPLANT
COVER SURGICAL LIGHT HANDLE (MISCELLANEOUS) ×1 IMPLANT
CUTTER FLEX LINEAR 45M (STAPLE) IMPLANT
DERMABOND ADVANCED .7 DNX12 (GAUZE/BANDAGES/DRESSINGS) ×1 IMPLANT
DRAIN CHANNEL 19F RND (DRAIN) IMPLANT
ELECT REM PT RETURN 15FT ADLT (MISCELLANEOUS) ×1 IMPLANT
ENDOLOOP SUT PDS II  0 18 (SUTURE)
ENDOLOOP SUT PDS II 0 18 (SUTURE) IMPLANT
EVACUATOR SILICONE 100CC (DRAIN) IMPLANT
GLOVE BIO SURGEON STRL SZ 6 (GLOVE) ×1 IMPLANT
GLOVE BIOGEL PI MICRO STRL 5.5 (GLOVE) ×1 IMPLANT
GLOVE INDICATOR 6.5 STRL GRN (GLOVE) ×1 IMPLANT
GOWN STRL REUS W/ TWL LRG LVL3 (GOWN DISPOSABLE) ×1 IMPLANT
GOWN STRL REUS W/TWL LRG LVL3 (GOWN DISPOSABLE) ×1
GRASPER SUT TROCAR 14GX15 (MISCELLANEOUS) IMPLANT
IRRIG SUCT STRYKERFLOW 2 WTIP (MISCELLANEOUS) ×1
IRRIGATION SUCT STRKRFLW 2 WTP (MISCELLANEOUS) ×1 IMPLANT
KIT BASIN OR (CUSTOM PROCEDURE TRAY) ×1 IMPLANT
KIT TURNOVER KIT A (KITS) IMPLANT
NDL INSUFFLATION 14GA 120MM (NEEDLE) IMPLANT
NEEDLE INSUFFLATION 14GA 120MM (NEEDLE) IMPLANT
PENCIL SMOKE EVACUATOR (MISCELLANEOUS) IMPLANT
RELOAD 45 VASCULAR/THIN (ENDOMECHANICALS) IMPLANT
RELOAD STAPLE 45 2.5 WHT GRN (ENDOMECHANICALS) IMPLANT
RELOAD STAPLE 45 3.5 BLU ETS (ENDOMECHANICALS) IMPLANT
RELOAD STAPLE 60 3.6 BLU REG (STAPLE) IMPLANT
RELOAD STAPLE TA45 3.5 REG BLU (ENDOMECHANICALS) IMPLANT
RELOAD STAPLER BLUE 60MM (STAPLE) ×2 IMPLANT
SCISSORS LAP 5X35 DISP (ENDOMECHANICALS) ×1 IMPLANT
SET TUBE SMOKE EVAC HIGH FLOW (TUBING) ×1 IMPLANT
SHEARS HARMONIC ACE PLUS 36CM (ENDOMECHANICALS) IMPLANT
SLEEVE Z-THREAD 5X100MM (TROCAR) ×1 IMPLANT
SPIKE FLUID TRANSFER (MISCELLANEOUS) ×1 IMPLANT
STAPLE ECHEON FLEX 60 POW ENDO (STAPLE) IMPLANT
STAPLER RELOAD BLUE 60MM (STAPLE) ×2
SUT ETHILON 2 0 PS N (SUTURE) IMPLANT
SUT MNCRL AB 4-0 PS2 18 (SUTURE) ×1 IMPLANT
SUT VICRYL 0 UR6 27IN ABS (SUTURE) IMPLANT
SYS BAG RETRIEVAL 10MM (BASKET) ×1
SYSTEM BAG RETRIEVAL 10MM (BASKET) ×1 IMPLANT
TOWEL OR 17X26 10 PK STRL BLUE (TOWEL DISPOSABLE) ×1 IMPLANT
TOWEL OR NON WOVEN STRL DISP B (DISPOSABLE) ×1 IMPLANT
TRAY FOLEY MTR SLVR 14FR STAT (SET/KITS/TRAYS/PACK) IMPLANT
TRAY FOLEY MTR SLVR 16FR STAT (SET/KITS/TRAYS/PACK) IMPLANT
TRAY LAPAROSCOPIC (CUSTOM PROCEDURE TRAY) ×1 IMPLANT
TROCAR ADV FIXATION 12X100MM (TROCAR) ×1 IMPLANT
TROCAR BALLN 12MMX100 BLUNT (TROCAR) ×1 IMPLANT
TROCAR Z-THREAD OPTICAL 5X100M (TROCAR) ×1 IMPLANT

## 2023-04-23 NOTE — Anesthesia Procedure Notes (Addendum)
Procedure Name: Intubation Date/Time: 04/23/2023 11:26 AM  Performed by: Sindy Guadeloupe, CRNAPre-anesthesia Checklist: Patient identified, Emergency Drugs available, Suction available, Patient being monitored and Timeout performed Patient Re-evaluated:Patient Re-evaluated prior to induction Oxygen Delivery Method: Circle system utilized Preoxygenation: Pre-oxygenation with 100% oxygen Induction Type: IV induction Ventilation: Mask ventilation without difficulty Laryngoscope Size: Mac and 3 Grade View: Grade I Tube type: Oral Tube size: 6.5 mm Number of attempts: 1 Airway Equipment and Method: Stylet Placement Confirmation: ETT inserted through vocal cords under direct vision, positive ETCO2 and breath sounds checked- equal and bilateral Secured at: 21 cm Tube secured with: Tape Dental Injury: Teeth and Oropharynx as per pre-operative assessment

## 2023-04-23 NOTE — Transfer of Care (Signed)
Immediate Anesthesia Transfer of Care Note  Patient: Aaron Heath  Procedure(s) Performed: APPENDECTOMY LAPAROSCOPIC  Patient Location: PACU  Anesthesia Type:General  Level of Consciousness: awake, drowsy, and patient cooperative  Airway & Oxygen Therapy: Patient Spontanous Breathing and Patient connected to face mask oxygen  Post-op Assessment: Report given to RN and Post -op Vital signs reviewed and stable  Post vital signs: Reviewed and stable  Last Vitals:  Vitals Value Taken Time  BP 139/89 04/23/23 1316  Temp    Pulse 84   Resp 11 04/23/23 1317  SpO2 98   Vitals shown include unvalidated device data.  Last Pain:  Vitals:   04/23/23 1015  TempSrc:   PainSc: 4       Patients Stated Pain Goal: 4 (04/23/23 1015)  Complications: No notable events documented.

## 2023-04-23 NOTE — ED Provider Notes (Signed)
Pt stable He has been admitted to general surgery service Family updated on plan   Zadie Rhine, MD 04/23/23 [phone]

## 2023-04-23 NOTE — ED Provider Notes (Signed)
[facility] Provider Note   CSN: [id number] Arrival date & time: 04/22/23  2027     History  Chief Complaint  Patient presents with   Emesis    Aaron Heath is a 22 y.o. male.  History provided by: caregiver. The history is limited by the condition of the patient and a developmental delay.  Emesis Severity:  Mild Duration:  1 day Timing:  Rare Quality:  Undigested food Progression:  Unchanged Chronicity:  New Recent urination:  Normal Context: not post-tussive   Relieved by:  Nothing Worsened by:  Nothing Ineffective treatments:  None tried Associated symptoms: diarrhea   Risk factors: no prior abdominal surgery   Patient  With blindness and FAS presents with his caregiver for n/v/d.  No fevers.     Past Medical History:  Diagnosis Date   ADHD (attention deficit hyperactivity disorder)    Asthma    Blindness, cortical BOTH EYES   Chiari malformation    Cognitive deficits COGNITIVE LEVE AGE 54   Congenital hydrocephalus (HCC)    CP (cerebral palsy) (HCC) MILD FORM   CURRENTLY TIOLET TRAINING   Fetal alcohol syndrome    Generally unsteady    Hypoglycemia 02/14/2013   Hypotension, unspecified 02/11/2013   Optic nerve atrophy    Oral motor dysfunction OCCASIONALLY WHEN HAS TO MUCH FOOD IN MOUTH- HAS TO BE REMINDED TO SWALLOW   Pedal edema 02/14/2013   Risk for falls DUE TO CP-- WEARS HELMET   S/P VP shunt    Seizures (HCC) LAST SEIZURE 2010   NEUROLOGIST- DR Anselm Jungling- LOV 08-23-2012  AT [facility]   Sepsis (HCC) 02/11/2013     Home Medications Prior to Admission medications   Medication Sig Start Date End Date Taking? Authorizing Provider  ARIPiprazole (ABILIFY) 10 MG tablet Take 15 mg by mouth in the morning and at bedtime.     [provider]  cetirizine (ZYRTEC) 10 MG tablet TAKE 1 TABLET BY MOUTH (=TO 10MG ) ONCE DAILY. 03/04/23   Cora Collum, DO  cholecalciferol 25 MCG (1000 UT) tablet Take 1  tablet (1,000 Units total) by mouth daily. 03/23/23   Cora Collum, DO  divalproex (DEPAKOTE) 500 MG DR tablet Take 500 mg by mouth in the morning and at bedtime.    [provider]  feeding supplement (ENSURE ENLIVE / ENSURE PLUS) LIQD Take 237 mLs by mouth 2 (two) times daily between meals. 01/16/23   Cora Collum, DO  fluticasone (FLONASE) 50 MCG/ACT nasal spray PLACE 2 SPRAYS INTO BOTH NOSTRILS DAILY. 09/16/22   Cora Collum, DO  guanFACINE (TENEX) 1 MG tablet Take 1 tablet (1 mg total) by mouth at bedtime. 10/18/21   Cora Collum, DO  hydrOXYzine (ATARAX/VISTARIL) 25 MG tablet Take 25 mg by mouth 3 (three) times daily as needed. Twice daily and once as needed    [provider]  levothyroxine (SYNTHROID) 50 MCG tablet TAKE 1 TABLET BY MOUTH ONCE DAILY BEFORE BREAKFAST. 10/16/22   Cora Collum, DO  melatonin 3 MG TABS tablet Take 1 tablet (3 mg total) by mouth at bedtime. 10/18/21   Cora Collum, DO  multivitamin-iron-minerals-folic acid (CENTRUM) chewable tablet Chew 1 tablet by mouth daily. 11/21/20   Allayne Stack, DO  Pediatric Multiple Vitamins (ANIMAL SHAPES) CHEW TAKE 1 VITAMIN BY MOUTH DAILY. 12/18/22   Cora Collum, DO  polyethylene glycol (MIRALAX / GLYCOLAX) 17 g packet MIX 17 GRAMS IN LIQUID  AND DRINK ONCE DAILY. 04/17/23   Cora Collum, DO      Allergies    Patient has no known allergies.    Review of Systems   Review of Systems  Unable to perform ROS: Other  Gastrointestinal:  Positive for diarrhea and vomiting.    Physical Exam Updated Vital Signs BP 122/86 (BP Location: Right Arm)   Pulse 78   Temp 98.2 F (36.8 C) (Oral)   Resp 17   Ht 5\' 4"  (1.626 m)   Wt 44 kg   SpO2 96%   BMI 16.65 kg/m  Physical Exam Vitals and nursing note reviewed.  Constitutional:      General: He is not in acute distress.    Appearance: Normal appearance. He is well-developed. He is not diaphoretic.  HENT:     Head: Atraumatic.      Nose: Nose normal.  Eyes:     Conjunctiva/sclera: Conjunctivae normal.     Pupils: Pupils are equal, round, and reactive to light.  Cardiovascular:     Rate and Rhythm: Normal rate and regular rhythm.     Pulses: Normal pulses.     Heart sounds: Normal heart sounds.  Pulmonary:     Effort: Pulmonary effort is normal.     Breath sounds: Normal breath sounds. No wheezing or rales.  Abdominal:     General: Bowel sounds are normal.     Tenderness: There is abdominal tenderness. There is guarding. There is no rebound.  Musculoskeletal:        General: Normal range of motion.     Cervical back: Normal range of motion and neck supple.  Skin:    General: Skin is warm and dry.     Capillary Refill: Capillary refill takes less than 2 seconds.  Neurological:     Mental Status: He is alert.     Deep Tendon Reflexes: Reflexes normal.     ED Results / Procedures / Treatments   Labs (all labs ordered are listed, but only abnormal results are displayed) Results for orders placed or performed during the hospital encounter of 04/22/23  Resp panel by RT-PCR (RSV, Flu A&B, Covid) Anterior Nasal Swab   Specimen: Anterior Nasal Swab  Result Value Ref Range   SARS Coronavirus 2 by RT PCR NEGATIVE NEGATIVE   Influenza A by PCR NEGATIVE NEGATIVE   Influenza B by PCR NEGATIVE NEGATIVE   Resp Syncytial Virus by PCR NEGATIVE NEGATIVE  CBC with Differential  Result Value Ref Range   WBC 10.3 4.0 - 10.5 K/uL   RBC 5.13 4.22 - 5.81 MIL/uL   Hemoglobin 14.9 13.0 - 17.0 g/dL   HCT 16.1 09.6 - 04.5 %   MCV 87.1 80.0 - 100.0 fL   MCH 29.0 26.0 - 34.0 pg   MCHC 33.3 30.0 - 36.0 g/dL   RDW 40.9 81.1 - 91.4 %   Platelets 97 (L) 150 - 400 K/uL   nRBC 0.0 0.0 - 0.2 %   Neutrophils Relative % 84 %   Neutro Abs 8.7 (H) 1.7 - 7.7 K/uL   Lymphocytes Relative 6 %   Lymphs Abs 0.6 (L) 0.7 - 4.0 K/uL   Monocytes Relative 9 %   Monocytes Absolute 0.9 0.1 - 1.0 K/uL   Eosinophils Relative 0 %   Eosinophils  Absolute 0.0 0.0 - 0.5 K/uL   Basophils Relative 0 %   Basophils Absolute 0.0 0.0 - 0.1 K/uL   Immature Granulocytes 1 %   Abs Immature Granulocytes  0.05 0.00 - 0.07 K/uL  Comprehensive metabolic panel  Result Value Ref Range   Sodium 137 135 - 145 mmol/L   Potassium 4.2 3.5 - 5.1 mmol/L   Chloride 97 (L) 98 - 111 mmol/L   CO2 30 22 - 32 mmol/L   Glucose, Bld 108 (H) 70 - 99 mg/dL   BUN 31 (H) 6 - 20 mg/dL   Creatinine, Ser 4.09 0.61 - 1.24 mg/dL   Calcium 9.6 8.9 - 81.1 mg/dL   Total Protein 8.6 (H) 6.5 - 8.1 g/dL   Albumin 3.6 3.5 - 5.0 g/dL   AST 24 15 - 41 U/L   ALT 17 0 - 44 U/L   Alkaline Phosphatase 78 38 - 126 U/L   Total Bilirubin 0.7 0.3 - 1.2 mg/dL   GFR, Estimated >91 >47 mL/min   Anion gap 10 5 - 15   CT Renal Stone Study  Result Date: 04/23/2023 CLINICAL DATA:  Flank pain EXAM: CT ABDOMEN AND PELVIS WITHOUT CONTRAST TECHNIQUE: Multidetector CT imaging of the abdomen and pelvis was performed following the standard protocol without IV contrast. RADIATION DOSE REDUCTION: This exam was performed according to the departmental dose-optimization program which includes automated exposure control, adjustment of the mA and/or kV according to patient size and/or use of iterative reconstruction technique. COMPARISON:  05/05/2018 FINDINGS: Lower chest: No acute abnormality. Hepatobiliary: No focal liver abnormality is seen. No gallstones, gallbladder wall thickening, or biliary dilatation. Pancreas: Unremarkable. No pancreatic ductal dilatation or surrounding inflammatory changes. Spleen: Normal in size without focal abnormality. Adrenals/Urinary Tract: Adrenal glands are within normal limits. Kidneys demonstrate no renal calculi or obstructive changes. Ureters are within normal limits. Bladder is well distended. Stomach/Bowel: No obstructive or inflammatory changes of the colon are seen. The appendix is not discretely visualized. Inflammatory changes are noted in the right lower quadrant  without discrete abscess. Proximal to this there is dilatation of the small bowel suspicious for a partial small bowel obstruction likely reactive to the inflammatory changes in the right lower quadrant. More proximal small bowel and stomach appear within normal limits. Vascular/Lymphatic: No significant vascular findings are present. No enlarged abdominal or pelvic lymph nodes. Reproductive: Prostate is unremarkable. Other: Minimal free fluid is noted within the abdomen consistent with the CSF shunt. Shunt catheter is noted in the right upper quadrant with a small amount of fluid surrounding the liver. Musculoskeletal: No acute or significant osseous findings. IMPRESSION: Constellation of findings suggestive of acute appendicitis with inflammatory changes in the right lower quadrant and a likely reactive partial small bowel obstruction. No other focal abnormality is noted. Electronically Signed   By: Alcide Clever M.D.   On: 04/23/2023 02:32    Radiology CT Renal Stone Study  Result Date: 04/23/2023 CLINICAL DATA:  Flank pain EXAM: CT ABDOMEN AND PELVIS WITHOUT CONTRAST TECHNIQUE: Multidetector CT imaging of the abdomen and pelvis was performed following the standard protocol without IV contrast. RADIATION DOSE REDUCTION: This exam was performed according to the departmental dose-optimization program which includes automated exposure control, adjustment of the mA and/or kV according to patient size and/or use of iterative reconstruction technique. COMPARISON:  05/05/2018 FINDINGS: Lower chest: No acute abnormality. Hepatobiliary: No focal liver abnormality is seen. No gallstones, gallbladder wall thickening, or biliary dilatation. Pancreas: Unremarkable. No pancreatic ductal dilatation or surrounding inflammatory changes. Spleen: Normal in size without focal abnormality. Adrenals/Urinary Tract: Adrenal glands are within normal limits. Kidneys demonstrate no renal calculi or obstructive changes. Ureters are  within normal limits. Bladder is  well distended. Stomach/Bowel: No obstructive or inflammatory changes of the colon are seen. The appendix is not discretely visualized. Inflammatory changes are noted in the right lower quadrant without discrete abscess. Proximal to this there is dilatation of the small bowel suspicious for a partial small bowel obstruction likely reactive to the inflammatory changes in the right lower quadrant. More proximal small bowel and stomach appear within normal limits. Vascular/Lymphatic: No significant vascular findings are present. No enlarged abdominal or pelvic lymph nodes. Reproductive: Prostate is unremarkable. Other: Minimal free fluid is noted within the abdomen consistent with the CSF shunt. Shunt catheter is noted in the right upper quadrant with a small amount of fluid surrounding the liver. Musculoskeletal: No acute or significant osseous findings. IMPRESSION: Constellation of findings suggestive of acute appendicitis with inflammatory changes in the right lower quadrant and a likely reactive partial small bowel obstruction. No other focal abnormality is noted. Electronically Signed   By: Alcide Clever M.D.   On: 04/23/2023 02:32    Procedures Procedures    Medications Ordered in ED Medications  iohexol (OMNIPAQUE) 300 MG/ML solution 100 mL (has no administration in time range)  cefTRIAXone (ROCEPHIN) 2 g in sodium chloride 0.9 % 100 mL IVPB (has no administration in time range)    And  metroNIDAZOLE (FLAGYL) IVPB 500 mg (has no administration in time range)  ondansetron (ZOFRAN) injection 4 mg (4 mg Intravenous Given 04/23/23 0006)  sodium chloride 0.9 % bolus 500 mL ( Intravenous Stopped 04/23/23 0046)    ED Course/ Medical Decision Making/ A&P                             Medical Decision Making Patient has a legal guardian Geraldine Solar, at DSS.  I have contacted him  I have informed him of the transfer.  He cannot give consent will need to call DSS supervisor    Amount and/or Complexity of Data Reviewed Independent Historian: guardian    Details: Caregiver see above  Labs: ordered.    Details: Negative covid and flu white count 10.3, hemoglobin normal 14.9, low platelets 89K. Normal sodium 137, normal potassium 4.2, normal creatinine  Radiology: ordered and independent interpretation performed.    Details: SBO by me on CT Discussion of management or test interpretation with external provider(s): Case d/w Dr. Bebe Shaggy who will accept the patient in transfer  Case d/w Dr. Luisa Hart ED to ED for surgery  Case d/w Mr. Carin Hock of DSS will need to call Willeen Niece for OR consent at [phone]   Risk Prescription drug management. Decision regarding hospitalization. Risk Details: Antibiotics initiated     Final Clinical Impression(s) / ED Diagnoses Final diagnoses:  Acute appendicitis, unspecified acute appendicitis type  Partial small bowel obstruction (HCC)    The patient appears reasonably stabilized for admission considering the current resources, flow, and capabilities available in the ED at this time, and I doubt any other [facility] requiring further screening and/or treatment in the ED prior to admission.  Rx / DC Orders ED Discharge Orders     None         Nallely Yost, MD 04/23/23 [phone]

## 2023-04-23 NOTE — Op Note (Signed)
Date: 04/23/23  Patient: Aaron Heath MRN: [id number]  Preoperative Diagnosis: Acute appendicitis Postoperative Diagnosis: Acute perforated appendicitis  Procedure: Laparoscopic appendectomy  Surgeon: Sophronia Simas, MD  EBL: 50 mL  Anesthesia: General endotracheal  Specimens: Appendix  Indications: Mr. Aaron Heath is a 22 yo male with a history of cerebral palsy and seizure disorder who presented to the ED with worsening abdominal pain, nausea and vomiting. A CT scan was consistent with acute appendicitis. He was brought to the operating room  Findings: Perforated appendicitis with a large phlegmon adjacent to the anterior abdominal wall, and a small periappendiceal abscess. He was started on antibiotics and brought to the operating room urgently for appendectomy.  Procedure details: Informed consent was obtained in the preoperative area prior to the procedure. The patient was brought to the operating room and placed on the table in the supine position. General anesthesia was induced and appropriate lines and drains were placed for intraoperative monitoring. Perioperative antibiotics were administered per SCIP guidelines. The abdomen was prepped and draped in the usual sterile fashion. A pre-procedure timeout was taken verifying patient identity, surgical site and procedure to be performed.  A small supraumbilical skin incision was made and the subcutaneous tissue was spread to expose the fascia. The umbilical stalk was grasped and elevated, and the fascia was sharply incised. The peritoneal cavity was visualized and a 12mm Hasson trocar was inserted. The abdomen was inspected with no evidence of visceral or vascular injury. The small bowel was mildly dilated. In the lower abdomen and pelvis, numerous loops of small bowel and a portion of the colon were adherent to the abdominal wall. The right lower quadrant and midline lower abdomen were completely occluded by adhesions. The RUQ also had  adhesions, presumably from the prior VP shunt placement. A 5mm port was placed in a free space in the LLQ under direct visualization. A suction irrigator was used to gently separate the small bowel from the abdominal wall in the lower abdomen.  A large phlegmon was encountered during this dissection. On further mobilization, what appeared to be appendix became visible in the center of the phlegmon, adherent to the lower midline abdominal wall. The cecum was then identified and was grossly normal in appearance, although it was adherent to the anterior abdominal wall in the RLQ. An additional 5mm port was placed in a free space in the right mid-abdomen under direct visualization. The cecum was separated from the abdominal wall sharply, and at this point a clear avascular plane was entered. This plane was bluntly developed to further mobilize the cecum off the abdominal wall. The appendix was then separated from the abdominal wall using blunt dissection and harmonic shears. A small abscess cavity was entered and drained adjacent to the appendix. A mesenteric window was created at the base of the appendix. The LLQ port was upsized to a 12mm trocar to accommodate the stapler at an appropriate angle. A 60mm powered stapler with a blue load was used to divide the appendix, taking a small portion of cecum with the appendix. A second staple fire was needed to completely separate the appendix from the cecum. The mesoappendix was then divided with harmonic shears, staying very close to the appendix. The mesoappendix was very thickened and there was bleeding from the appendiceal artery, which was controlled with placement of metal clips. The appendix itself was very thickened and friable. Once the appendix was completely separated from the mesoappendix, it was placed in an Endocatch bag and removed, and  sent for routine pathology. The surgical site was copiously irrigated with sterile saline. The cecal staple line was examined  and appeared hemostatic. The cut surface of the mesoappendix was hemostatic, and the cecum and terminal ileum were in tact with no signs of injury.  The ports were removed and the pneumoperitoneum was evacuated. The umbilical port site fascia and the LLQ port site fascia were closed with 0 Vicryl suture. The skin at all port sites was closed with 4-0 monocryl subcuticular suture. Dermabond was applied.   Upon entering the abdomen (organ space), I encountered an abscess in the periappendiceal space .  CASE DATA:  Type of patient?: LDOW CASE (Surgical Hospitalist [facility] Inpatient)  Status of Case? URGENT Add On  Infection Present At Time Of Surgery (PATOS)?  ABSCESS in the appendix    The patient tolerated the procedure well with no apparent complications. All counts were correct x2 at the end of the procedure. The patient was extubated and taken to PACU in stable condition.  Sophronia Simas, MD 04/23/23 1:09 PM

## 2023-04-23 NOTE — Anesthesia Preprocedure Evaluation (Addendum)
Anesthesia Evaluation  Patient identified by MRN, date of birth, ID band Patient awake    Reviewed: Allergy & Precautions, H&P , NPO status , Patient's Chart, lab work & pertinent test results  Airway Mallampati: II  TM Distance: >3 FB Neck ROM: Full    Dental  (+) Teeth Intact, Dental Advisory Given   Pulmonary asthma    Pulmonary exam normal breath sounds clear to auscultation       Cardiovascular negative cardio ROS Normal cardiovascular exam Rhythm:Regular Rate:Normal     Neuro/Psych Seizures -, Well Controlled,  Mild CP Well controlled seizures- has been about 5 years since last seizure Hydrocephalus s/p VP shunt, chiari malformation Cortical blindness B/L   Neuromuscular disease  negative psych ROS   GI/Hepatic Neg liver ROS,,,Acute appendicitis Baseline oral motor dysfunction   Endo/Other  Hypothyroidism    Renal/GU negative Renal ROS  negative genitourinary   Musculoskeletal negative musculoskeletal ROS (+)    Abdominal   Peds negative pediatric ROS (+)  Hematology negative hematology ROS (+) Hb 14.9, plt 97- borderline low plt on all previous labs as well   Anesthesia Other Findings   Reproductive/Obstetrics negative OB ROS                             Anesthesia Physical Anesthesia Plan  ASA: 3  Anesthesia Plan: General   Post-op Pain Management: Tylenol PO (pre-op)*, Toradol IV (intra-op)* and Precedex   Induction: Intravenous, Rapid sequence and Cricoid pressure planned  PONV Risk Score and Plan: 3 and Ondansetron, Dexamethasone, Midazolam and Treatment may vary due to age or medical condition  Airway Management Planned: Oral ETT  Additional Equipment: None  Intra-op Plan:   Post-operative Plan: Extubation in OR  Informed Consent: I have reviewed the patients History and Physical, chart, labs and discussed the procedure including the risks, benefits and  alternatives for the proposed anesthesia with the patient or authorized representative who has indicated his/her understanding and acceptance.     Dental advisory given and Consent reviewed with POA  Plan Discussed with: CRNA  Anesthesia Plan Comments:        Anesthesia Quick Evaluation

## 2023-04-23 NOTE — ED Notes (Signed)
Please inform Miss Okey Dupre of any information regarding pt.

## 2023-04-23 NOTE — H&P (Signed)
Aaron Heath 09-26-2001  [id number].    Requesting MD: Nicanor Alcon, MD Chief Complaint/Reason for Consult: appendicitis   HPI:  Aaron Heath is a 22 y/o M with a PMH CP, developmental delay, seizures, and VP shunt placement who was evaluated this morning at [facility] and found to have acute appendicitis. During my exam the patient is alone, his caretaker just left the hospital.   I called his caretaker, Aaron Heath, who has been taking care of this patient for 5 years. She tells me that she noticed something was abnormal on Tuesday, 2 days ago, when he was walking a little slumped over. Otherwise he had a normal day, went to school. Yesterday he went to school where he had two episodes of incontinence of stool, which is atypical for him. He also had an episode of vomiting along with decreased oral intake, this prompted [name] to take him to [facility] for further evaluation. She tells me that he has not had a seizure in over 5 years.    ROS: Review of Systems  All other systems reviewed and are negative.   History reviewed. No pertinent family history.  Past Medical History:  Diagnosis Date   ADHD (attention deficit hyperactivity disorder)    Asthma    Blindness, cortical BOTH EYES   Chiari malformation    Cognitive deficits COGNITIVE LEVE AGE 32   Congenital hydrocephalus (HCC)    CP (cerebral palsy) (HCC) MILD FORM   CURRENTLY TIOLET TRAINING   Fetal alcohol syndrome    Generally unsteady    Hypoglycemia 02/14/2013   Hypotension, unspecified 02/11/2013   Optic nerve atrophy    Oral motor dysfunction OCCASIONALLY WHEN HAS TO MUCH FOOD IN MOUTH- HAS TO BE REMINDED TO SWALLOW   Pedal edema 02/14/2013   Risk for falls DUE TO CP-- WEARS HELMET   S/P VP shunt    Seizures (HCC) LAST SEIZURE 2010   NEUROLOGIST- DR Anselm Jungling- LOV 08-23-2012  AT [facility]   Sepsis (HCC) 02/11/2013    Past Surgical History:  Procedure Laterality Date   BILATERAL  EYE  LATERAL RECTUS MUSCLE RECESSION AND INFERIOR OBLIQUE MUSCLE RECESSION  11-30-2003  DR Chrissie Noa [facility]   V-PATTERN EXOTROPIA   CSF SHUNT  AT BIRTH   HERNIA REPAIR  DATE UNKNOWN   MEDIAN RECTUS REPAIR  09/08/2012   Procedure: MEDIAN RECTUS REPAIR;  Surgeon: Corinda Gubler, MD;  Location: [facility];  Service: Ophthalmology;  Laterality: Bilateral;  inferior oblique myectomy  lateral rectus resection      Social History:  reports that he has never smoked. He has been exposed to tobacco smoke. He has never used smokeless tobacco. He reports that he does not drink alcohol and does not use drugs.  Allergies: No Known Allergies  (Not in a hospital admission)    Physical Exam: Blood pressure 122/61, pulse 66, temperature 98.3 F (36.8 C), temperature source Oral, resp. rate 18, height 5\' 4"  (1.626 m), weight 44 kg, SpO2 98 %. General: Pleasant, sitting up in hospital bed, NAD HEENT: head with some chronic deformity, pupils are equal and round Neck- Trachea is midline CV- RRR, normal S1/S2, no M/R/G, no extremity edema Pulm- breathing is non-labored. ORA CTABL, no wheezes, rhales, rhonchi. Abd- soft, focally tender to palpation in the RLQ with guarding, without peritonitis, no hernias. Previous scar in RUQ/  GU- deferred  MSK- UE/LE symmetrical, no cyanosis, clubbing, or edema. Neuro- non-focal exam Psych- oriented to  person, hospital  Skin: warm and dry, no rashes or lesions   Results for orders placed or performed during the hospital encounter of 04/22/23 (from the past 48 hour(s))  Resp panel by RT-PCR (RSV, Flu A&B, Covid) Anterior Nasal Swab     Status: None   Collection Time: 04/22/23  8:50 PM   Specimen: Anterior Nasal Swab  Result Value Ref Range   SARS Coronavirus 2 by RT PCR NEGATIVE NEGATIVE    Comment: (NOTE) SARS-CoV-2 target nucleic acids are NOT DETECTED.  The SARS-CoV-2 RNA is generally detectable in upper respiratory specimens during the  acute phase of infection. The lowest concentration of SARS-CoV-2 viral copies this assay can detect is 138 copies/mL. A negative result does not preclude SARS-Cov-2 infection and should not be used as the sole basis for treatment or other patient management decisions. A negative result may occur with  improper specimen collection/handling, submission of specimen other than nasopharyngeal swab, presence of viral mutation(s) within the areas targeted by this assay, and inadequate number of viral copies(<138 copies/mL). A negative result must be combined with clinical observations, patient history, and epidemiological information. The expected result is Negative.  Fact Sheet for Patients:  BloggerCourse.com  Fact Sheet for Healthcare Providers:  SeriousBroker.it  This test is no t yet approved or cleared by the [place] FDA and  has been authorized for detection and/or diagnosis of SARS-CoV-2 by FDA under an Emergency Use Authorization (EUA). This EUA will remain  in effect (meaning this test can be used) for the duration of the COVID-19 declaration under Section 564(b)(1) of the Act, 21 U.S.C.section 360bbb-3(b)(1), unless the authorization is terminated  or revoked sooner.       Influenza A by PCR NEGATIVE NEGATIVE   Influenza B by PCR NEGATIVE NEGATIVE    Comment: (NOTE) The Xpert Xpress SARS-CoV-2/FLU/RSV plus assay is intended as an aid in the diagnosis of influenza from Nasopharyngeal swab specimens and should not be used as a sole basis for treatment. Nasal washings and aspirates are unacceptable for Xpert Xpress SARS-CoV-2/FLU/RSV testing.  Fact Sheet for Patients: BloggerCourse.com  Fact Sheet for Healthcare Providers: SeriousBroker.it  This test is not yet approved or cleared by the [place] FDA and has been authorized for detection and/or diagnosis of  SARS-CoV-2 by FDA under an Emergency Use Authorization (EUA). This EUA will remain in effect (meaning this test can be used) for the duration of the COVID-19 declaration under Section 564(b)(1) of the Act, 21 U.S.C. section 360bbb-3(b)(1), unless the authorization is terminated or revoked.     Resp Syncytial Virus by PCR NEGATIVE NEGATIVE    Comment: (NOTE) Fact Sheet for Patients: BloggerCourse.com  Fact Sheet for Healthcare Providers: SeriousBroker.it  This test is not yet approved or cleared by the [place] FDA and has been authorized for detection and/or diagnosis of SARS-CoV-2 by FDA under an Emergency Use Authorization (EUA). This EUA will remain in effect (meaning this test can be used) for the duration of the COVID-19 declaration under Section 564(b)(1) of the Act, 21 U.S.C. section 360bbb-3(b)(1), unless the authorization is terminated or revoked.  Performed at [facility], [address] [place], [place] [address]   CBC with Differential     Status: Abnormal   Collection Time: 04/23/23 12:02 AM  Result Value Ref Range   WBC 10.3 4.0 - 10.5 K/uL   RBC 5.13 4.22 - 5.81 MIL/uL   Hemoglobin 14.9 13.0 - 17.0 g/dL   HCT 47.8 29.5 - 62.1 %  MCV 87.1 80.0 - 100.0 fL   MCH 29.0 26.0 - 34.0 pg   MCHC 33.3 30.0 - 36.0 g/dL   RDW 16.1 09.6 - 04.5 %   Platelets 97 (L) 150 - 400 K/uL    Comment: SPECIMEN CHECKED FOR CLOTS CONSISTENT WITH PREVIOUS RESULT REPEATED TO VERIFY    nRBC 0.0 0.0 - 0.2 %   Neutrophils Relative % 84 %   Neutro Abs 8.7 (H) 1.7 - 7.7 K/uL   Lymphocytes Relative 6 %   Lymphs Abs 0.6 (L) 0.7 - 4.0 K/uL   Monocytes Relative 9 %   Monocytes Absolute 0.9 0.1 - 1.0 K/uL   Eosinophils Relative 0 %   Eosinophils Absolute 0.0 0.0 - 0.5 K/uL   Basophils Relative 0 %   Basophils Absolute 0.0 0.0 - 0.1 K/uL   Immature Granulocytes 1 %   Abs Immature Granulocytes 0.05 0.00 - 0.07 K/uL     Comment: Performed at [facility], [address] [place], [place] [address]  Comprehensive metabolic panel     Status: Abnormal   Collection Time: 04/23/23 12:02 AM  Result Value Ref Range   Sodium 137 135 - 145 mmol/L   Potassium 4.2 3.5 - 5.1 mmol/L   Chloride 97 (L) 98 - 111 mmol/L   CO2 30 22 - 32 mmol/L   Glucose, Bld 108 (H) 70 - 99 mg/dL    Comment: Glucose reference range applies only to samples taken after fasting for at least 8 hours.   BUN 31 (H) 6 - 20 mg/dL   Creatinine, Ser 1.91 0.61 - 1.24 mg/dL   Calcium 9.6 8.9 - 47.8 mg/dL   Total Protein 8.6 (H) 6.5 - 8.1 g/dL   Albumin 3.6 3.5 - 5.0 g/dL   AST 24 15 - 41 U/L   ALT 17 0 - 44 U/L   Alkaline Phosphatase 78 38 - 126 U/L   Total Bilirubin 0.7 0.3 - 1.2 mg/dL   GFR, Estimated >29 >56 mL/min    Comment: (NOTE) Calculated using the CKD-EPI Creatinine Equation (2021)    Anion gap 10 5 - 15    Comment: Performed at [facility], [address] [place], [place] [address]   CT Renal Stone Study  Result Date: 04/23/2023 CLINICAL DATA:  Flank pain EXAM: CT ABDOMEN AND PELVIS WITHOUT CONTRAST TECHNIQUE: Multidetector CT imaging of the abdomen and pelvis was performed following the standard protocol without IV contrast. RADIATION DOSE REDUCTION: This exam was performed according to the departmental dose-optimization program which includes automated exposure control, adjustment of the mA and/or kV according to patient size and/or use of iterative reconstruction technique. COMPARISON:  05/05/2018 FINDINGS: Lower chest: No acute abnormality. Hepatobiliary: No focal liver abnormality is seen. No gallstones, gallbladder wall thickening, or biliary dilatation. Pancreas: Unremarkable. No pancreatic ductal dilatation or surrounding inflammatory changes. Spleen: Normal in size without focal abnormality. Adrenals/Urinary Tract: Adrenal glands are within normal limits. Kidneys demonstrate no renal calculi or obstructive  changes. Ureters are within normal limits. Bladder is well distended. Stomach/Bowel: No obstructive or inflammatory changes of the colon are seen. The appendix is not discretely visualized. Inflammatory changes are noted in the right lower quadrant without discrete abscess. Proximal to this there is dilatation of the small bowel suspicious for a partial small bowel obstruction likely reactive to the inflammatory changes in the right lower quadrant. More proximal small bowel and stomach appear within normal limits. Vascular/Lymphatic: No significant vascular findings are present. No  enlarged abdominal or pelvic lymph nodes. Reproductive: Prostate is unremarkable. Other: Minimal free fluid is noted within the abdomen consistent with the CSF shunt. Shunt catheter is noted in the right upper quadrant with a small amount of fluid surrounding the liver. Musculoskeletal: No acute or significant osseous findings. IMPRESSION: Constellation of findings suggestive of acute appendicitis with inflammatory changes in the right lower quadrant and a likely reactive partial small bowel obstruction. No other focal abnormality is noted. Electronically Signed   By: Alcide Clever M.D.   On: 04/23/2023 02:32      Assessment/Plan Acute appendicitis without evidence of perofration 21 y/o M with medical history of CP, developmental delay, VP shunt who is here with acute appendicitis. He is currently in under the care of the department of social services. I spoke to his social worker Gabriel Rung Suggs who directed me to the the director of [place] social services, Jay Schlichter. Jasmine December confirmed that she is responsible for giving consent for invasive medical procedures on Koa Biderman' behalf.   We discussed my recommendation for operative management of acute appendicitis. We discussed why medical management with antibiotics alone is not the standard of care. We discussed the risks of surgery including bleeding, infection, conversion to  open, damage to surrounding structures, need for additional procedures/surgeries, prolonged hospital stay, and in Mr. Deliman' case, damage to or externalization of his VP shunt.    FEN - NPO, IVF VTE - SCD's ID - Rocephin/Flagyl given 0315 Admit - ccs service, observation  I reviewed nursing notes, ED provider notes, last 24 h vitals and pain scores, last 48 h intake and output, last 24 h labs and trends, and last 24 h imaging results.  Adam Phenix, [facility] 04/23/2023, 8:49 AM Please see Amion for pager number during day hours 7:00am-4:30pm or 7:00am -11:30am on weekends\

## 2023-04-24 ENCOUNTER — Encounter (HOSPITAL_COMMUNITY): Payer: Self-pay | Admitting: Surgery

## 2023-04-24 LAB — CBC
HCT: 37.8 % — ABNORMAL LOW (ref 39.0–52.0)
Hemoglobin: 12.2 g/dL — ABNORMAL LOW (ref 13.0–17.0)
MCH: 29.3 pg (ref 26.0–34.0)
MCHC: 32.3 g/dL (ref 30.0–36.0)
MCV: 90.9 fL (ref 80.0–100.0)
Platelets: 119 10*3/uL — ABNORMAL LOW (ref 150–400)
RBC: 4.16 MIL/uL — ABNORMAL LOW (ref 4.22–5.81)
RDW: 13.5 % (ref 11.5–15.5)
WBC: 11.3 10*3/uL — ABNORMAL HIGH (ref 4.0–10.5)
nRBC: 0 % (ref 0.0–0.2)

## 2023-04-24 LAB — BASIC METABOLIC PANEL
Anion gap: 8 (ref 5–15)
BUN: 17 mg/dL (ref 6–20)
CO2: 26 mmol/L (ref 22–32)
Calcium: 8.5 mg/dL — ABNORMAL LOW (ref 8.9–10.3)
Chloride: 101 mmol/L (ref 98–111)
Creatinine, Ser: 0.64 mg/dL (ref 0.61–1.24)
GFR, Estimated: >60 mL/min (ref >60)
Glucose, Bld: 95 mg/dL (ref 70–99)
Potassium: 4.1 mmol/L (ref 3.5–5.1)
Sodium: 135 mmol/L (ref 135–145)

## 2023-04-24 LAB — SURGICAL PATHOLOGY

## 2023-04-24 LAB — MAGNESIUM: Magnesium: 1.7 mg/dL (ref 1.7–2.4)

## 2023-04-24 MED ORDER — ENOXAPARIN SODIUM 30 MG/0.3ML IJ SOSY
30.0000 mg | PREFILLED_SYRINGE | INTRAMUSCULAR | Status: DC
  Administered 2023-04-24 – 2023-04-27 (×4): 30 mg via SUBCUTANEOUS
  Filled 2023-04-24 (×4): qty 0.3

## 2023-04-24 NOTE — Progress Notes (Signed)
Mobility Specialist - Progress Note   04/24/23 1018  Mobility  Activity Ambulated with assistance in hallway  Level of Assistance Standby assist, set-up cues, supervision of patient - no hands on  Assistive Device Front wheel walker  Distance Ambulated (ft) 275 ft  Activity Response Tolerated well  Mobility Referral Yes  $Mobility charge 1 Mobility  Mobility Specialist Start Time (ACUTE ONLY) 0955  Mobility Specialist Stop Time (ACUTE ONLY) 1018  Mobility Specialist Time Calculation (min) (ACUTE ONLY) 23 min   Pt received in bed and agreeable to mobility. No complaints during session. Pt to bed after session with all needs met.    [facility]

## 2023-04-24 NOTE — Progress Notes (Signed)
Spoke with [name] and gave her an update on patient's status.  She is coming to see him later today.  I did try to call Enrique Sack, his DSS legal guardian, but only got his voicemail.  Letha Cape 10:58 AM 04/24/2023

## 2023-04-24 NOTE — Anesthesia Postprocedure Evaluation (Signed)
Anesthesia Post Note  Patient: Aaron Heath  Procedure(s) Performed: APPENDECTOMY LAPAROSCOPIC     Patient location during evaluation: PACU Anesthesia Type: General Level of consciousness: awake and alert, oriented and patient cooperative Pain management: pain level controlled Vital Signs Assessment: post-procedure vital signs reviewed and stable Respiratory status: spontaneous breathing, nonlabored ventilation and respiratory function stable Cardiovascular status: blood pressure returned to baseline and stable Postop Assessment: no apparent nausea or vomiting Anesthetic complications: no   No notable events documented.  Last Vitals:  Vitals:   04/24/23 0615 04/24/23 0819  BP: (!) 111/51 (!) 99/52  Pulse: (!) 52 (!) 57  Resp: 18 18  Temp: 36.8 C (!) 36.4 C  SpO2: 94% 100%    Last Pain:  Vitals:   04/24/23 0819  TempSrc: Oral  PainSc:                  [name]

## 2023-04-24 NOTE — TOC Initial Note (Signed)
Transition of Care [facility]) - Initial/Assessment Note   Patient Details  Name: Aaron Heath MRN: [id number] Date of Birth: 02-07-2001  [facility] CM/SW Contact:    Ewing Schlein, LCSW Phone Number: 04/24/2023, 3:13 PM  Clinical Narrative: CSW spoke with patient's legal guardian, Enrique Sack, with DSS regarding discharge planning for patient. Per Mr. Snuggs, patient will return to his [facility] where he lives with his caregiver, Judi Cong, and his medications will need to be sent to [facility] in [place] at discharge. No FL2 will be needed prior to his return.          Expected Discharge Plan: Group Home Barriers to Discharge: Continued Medical Work up  Patient Goals and CMS Choice Patient states their goals for this hospitalization and ongoing recovery are:: Return to [facility] Choice offered to / list presented to : NA  Expected Discharge Plan and Services In-house Referral: Clinical Social Work Post Acute Care Choice: NA Living arrangements for the past 2 months: Group Home             DME Arranged: N/A DME Agency: NA  Prior Living Arrangements/Services Living arrangements for the past 2 months: Group Home Lives with:: Other (Comment) (Caregiver ([name])) Patient language and need for interpreter reviewed:: Yes Do you feel safe going back to the place where you live?: Yes      Need for Family Participation in Patient Care: Yes (Comment) (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.) Care giver support system in place?: Yes (comment) Criminal Activity/Legal Involvement Pertinent to Current Situation/Hospitalization: No - Comment as needed  Activities of Daily Living Home Assistive Devices/Equipment: None ADL Screening (condition at time of admission) Patient's cognitive ability adequate to safely complete daily activities?: No Is the patient deaf or have difficulty hearing?: No Does the patient have difficulty seeing, even when wearing glasses/contacts?:  Yes Does the patient have difficulty concentrating, remembering, or making decisions?: Yes Patient able to express need for assistance with ADLs?: Yes Does the patient have difficulty dressing or bathing?: Yes Independently performs ADLs?: No Communication: Independent Dressing (OT): Needs assistance Is this a change from baseline?: Pre-admission baseline Grooming: Needs assistance Is this a change from baseline?: Pre-admission baseline Feeding: Independent Bathing: Needs assistance Is this a change from baseline?: Pre-admission baseline Toileting: Needs assistance Is this a change from baseline?: Pre-admission baseline In/Out Bed: Needs assistance Is this a change from baseline?: Pre-admission baseline Walks in Home: Needs assistance Is this a change from baseline?: Pre-admission baseline Does the patient have difficulty walking or climbing stairs?: Yes Weakness of Legs: None Weakness of Arms/Hands: None  Permission Sought/Granted Share Information with NAME: Enrique Sack (legal guardian) Permission granted to share info w AGENCY: DSS  Emotional Assessment Appearance:: Appears stated age Orientation: : Oriented to Self, Oriented to Place Alcohol / Substance Use: Not Applicable Psych Involvement: No (comment)  Admission diagnosis:  Partial small bowel obstruction (HCC) [K56.600] Acute appendicitis, unspecified acute appendicitis type [K35.80] Perforated appendicitis [K35.32] Patient Active Problem List   Diagnosis Date Noted   Perforated appendicitis 04/23/2023   Health care maintenance 11/21/2020   Poor weight gain in adult 08/25/2020   Seasonal allergies 07/27/2020   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 - [address] [place] [address] Phone: [phone] Fax: [phone]  Social Determinants of Health (SDOH) Social History: SDOH Screenings   Food Insecurity: No Food Insecurity (02/06/2023)  Housing: Low Risk  (02/06/2023)  Utilities: Not At Risk (02/06/2023)  Depression (PHQ2-9): Low Risk  (02/06/2023)  Financial Resource Strain: Low Risk  (02/06/2023)  Physical Activity: Insufficiently Active (02/06/2023)  Social Connections: Socially Isolated (02/06/2023)  Stress: No Stress Concern Present (02/06/2023)  Tobacco Use: Medium Risk (04/24/2023)   SDOH Interventions:    Readmission Risk Interventions     No data to display

## 2023-04-24 NOTE — Progress Notes (Signed)
Mobility Specialist - Progress Note   04/24/23 1337  Mobility  Activity Ambulated with assistance in hallway  Level of Assistance Contact guard assist, steadying assist  Assistive Device Front wheel walker  Distance Ambulated (ft) 275 ft  Activity Response Tolerated well  Mobility Referral Yes  $Mobility charge 1 Mobility  Mobility Specialist Start Time (ACUTE ONLY) 0115  Mobility Specialist Stop Time (ACUTE ONLY) 0137  Mobility Specialist Time Calculation (min) (ACUTE ONLY) 22 min   Pt received in recliner and agreeable to mobility. No complaints during session. Pt to bed after session with all needs met.    [facility]

## 2023-04-24 NOTE — Discharge Instructions (Signed)
[facility] ° °Please arrive at least 30 min before your appointment to complete your check in paperwork.  If you are unable to arrive 30 min prior to your appointment time we may have to cancel or reschedule you. °LAPAROSCOPIC SURGERY: POST OP INSTRUCTIONS °Always review your discharge instruction sheet given to you by the facility where your surgery was performed. °IF YOU HAVE DISABILITY OR FAMILY LEAVE FORMS, YOU MUST BRING THEM TO THE OFFICE FOR PROCESSING.   °DO NOT GIVE THEM TO YOUR DOCTOR. ° °PAIN CONTROL ° °First take acetaminophen (Tylenol) AND/or ibuprofen (Advil) to control your pain after surgery.  Follow directions on package.  Taking acetaminophen (Tylenol) and/or ibuprofen (Advil) regularly after surgery will help to control your pain and lower the amount of prescription pain medication you may need.  You should not take more than 4,000 mg (4 grams) of acetaminophen (Tylenol) in 24 hours.  You should not take ibuprofen (Advil), aleve, motrin, naprosyn or other NSAIDS if you have a history of stomach ulcers or chronic kidney disease.  °A prescription for pain medication may be given to you upon discharge.  Take your pain medication as prescribed, if you still have uncontrolled pain after taking acetaminophen (Tylenol) or ibuprofen (Advil). °Use ice packs to help control pain. °If you need a refill on your pain medication, please contact your pharmacy.  They will contact our office to request authorization. Prescriptions will not be filled after 5pm or on week-ends. ° °HOME MEDICATIONS °Take your usually prescribed medications unless otherwise directed. ° °DIET °You should follow a light diet the first few days after arrival home.  Be sure to include lots of fluids daily. Avoid fatty, fried foods.  ° °CONSTIPATION °It is common to experience some constipation after surgery and if you are taking pain medication.  Increasing fluid intake and taking a stool softener (such as Colace)  will usually help or prevent this problem from occurring.  A mild laxative (Milk of Magnesia or Miralax) should be taken according to package instructions if there are no bowel movements after 48 hours. ° °WOUND/INCISION CARE °Most patients will experience some swelling and bruising in the area of the incisions.  Ice packs will help.  Swelling and bruising can take several days to resolve.  °Unless discharge instructions indicate otherwise, follow guidelines below  °STERI-STRIPS - you may remove your outer bandages 48 hours after surgery, and you may shower at that time.  You have steri-strips (small skin tapes) in place directly over the incision.  These strips should be left on the skin for 7-10 days.   °DERMABOND/SKIN GLUE - you may shower in 24 hours.  The glue will flake off over the next 2-3 weeks. °Any sutures or staples will be removed at the office during your follow-up visit. ° °ACTIVITIES °You may resume regular (light) daily activities beginning the next day--such as daily self-care, walking, climbing stairs--gradually increasing activities as tolerated.  You may have sexual intercourse when it is comfortable.  Refrain from any heavy lifting or straining until approved by your doctor. °You may drive when you are no longer taking prescription pain medication, you can comfortably wear a seatbelt, and you can safely maneuver your car and apply brakes. ° °FOLLOW-UP °You should see your doctor in the office for a follow-up appointment approximately 2-3 weeks after your surgery.  You should have been given your post-op/follow-up appointment when your surgery was scheduled.  If you did not receive a post-op/follow-up appointment, make sure   that you call for this appointment within a day or two after you arrive home to insure a convenient appointment time. ° ° °WHEN TO CALL YOUR DOCTOR: °Fever over 101.0 °Inability to urinate °Continued bleeding from incision. °Increased pain, redness, or drainage from the  incision. °Increasing abdominal pain ° °The clinic staff is available to answer your questions during regular business hours.  Please don’t hesitate to call and ask to speak to one of the nurses for clinical concerns.  If you have a medical emergency, go to the nearest emergency room or call 911.  A surgeon from [facility] is always on call at the hospital. °[address], [address], [place],   [address] ? [address] [address], [place],    [address] °[phone] ? [phone] ? FAX [phone] ° ° ° ° °Managing Your Pain After Surgery Without Opioids ° ° ° °Thank you for participating in our program to help patients manage their pain after surgery without opioids. This is part of our effort to provide you with the best care possible, without exposing you or your family to the risk that opioids pose. ° °What pain can I expect after surgery? °You can expect to have some pain after surgery. This is normal. The pain is typically worse the day after surgery, and quickly begins to get better. °Many studies have found that many patients are able to manage their pain after surgery with Over-the-Counter (OTC) medications such as Tylenol and Motrin. If you have a condition that does not allow you to take Tylenol or Motrin, notify your surgical team. ° °How will I manage my pain? °The best strategy for controlling your pain after surgery is around the clock pain control with Tylenol (acetaminophen) and Motrin (ibuprofen or Advil). Alternating these medications with each other allows you to maximize your pain control. In addition to Tylenol and Motrin, you can use heating pads or ice packs on your incisions to help reduce your pain. ° °How will I alternate your regular strength over-the-counter pain medication? °You will take a dose of pain medication every three hours. °Start by taking 650 mg of Tylenol (2 pills of 325 mg) °3 hours later take 600 mg of Motrin (3 pills of 200 mg) °3 hours after  taking the Motrin take 650 mg of Tylenol °3 hours after that take 600 mg of Motrin. ° ° °- 1 - ° °See example - if your first dose of Tylenol is at 12:00 PM ° ° °12:00 PM Tylenol 650 mg (2 pills of 325 mg)  °3:00 PM Motrin 600 mg (3 pills of 200 mg)  °6:00 PM Tylenol 650 mg (2 pills of 325 mg)  °9:00 PM Motrin 600 mg (3 pills of 200 mg)  °Continue alternating every 3 hours  ° °We recommend that you follow this schedule around-the-clock for at least 3 days after surgery, or until you feel that it is no longer needed. Use the table on the last page of this handout to keep track of the medications you are taking. °Important: °Do not take more than 3000mg of Tylenol or 3200mg of Motrin in a 24-hour period. °Do not take ibuprofen/Motrin if you have a history of bleeding stomach ulcers, severe kidney disease, &/or actively taking a blood thinner ° °What if I still have pain? °If you have pain that is not controlled with the over-the-counter pain medications (Tylenol and Motrin or Advil) you might have what we call “breakthrough” pain. You will receive a prescription   for a small amount of an opioid pain medication such as Oxycodone, Tramadol, or Tylenol with Codeine. Use these opioid pills in the first 24 hours after surgery if you have breakthrough pain. Do not take more than 1 pill every 4-6 hours. ° °If you still have uncontrolled pain after using all opioid pills, don't hesitate to call our staff using the number provided. We will help make sure you are managing your pain in the best way possible, and if necessary, we can provide a prescription for additional pain medication. ° ° °Day 1   ° °Time  °Name of Medication Number of pills taken  °Amount of Acetaminophen  °Pain Level  ° °Comments  °AM PM       °AM PM       °AM PM       °AM PM       °AM PM       °AM PM       °AM PM       °AM PM       °Total Daily amount of Acetaminophen °Do not take more than  3,000 mg per day    ° ° °Day 2   ° °Time  °Name of Medication  Number of pills °taken  °Amount of Acetaminophen  °Pain Level  ° °Comments  °AM PM       °AM PM       °AM PM       °AM PM       °AM PM       °AM PM       °AM PM       °AM PM       °Total Daily amount of Acetaminophen °Do not take more than  3,000 mg per day    ° ° °Day 3   ° °Time  °Name of Medication Number of pills taken  °Amount of Acetaminophen  °Pain Level  ° °Comments  °AM PM       °AM PM       °AM PM       °AM PM       ° ° ° °AM PM       °AM PM       °AM PM       °AM PM       °Total Daily amount of Acetaminophen °Do not take more than  3,000 mg per day    ° ° °Day 4   ° °Time  °Name of Medication Number of pills taken  °Amount of Acetaminophen  °Pain Level  ° °Comments  °AM PM       °AM PM       °AM PM       °AM PM       °AM PM       °AM PM       °AM PM       °AM PM       °Total Daily amount of Acetaminophen °Do not take more than  3,000 mg per day    ° ° °Day 5   ° °Time  °Name of Medication Number °of pills taken  °Amount of Acetaminophen  °Pain Level  ° °Comments  °AM PM       °AM PM       °AM PM       °AM PM       °AM PM       °AM   PM       °AM PM       °AM PM       °Total Daily amount of Acetaminophen °Do not take more than  3,000 mg per day    ° ° ° °Day 6   ° °Time  °Name of Medication Number of pills °taken  °Amount of Acetaminophen  °Pain Level  °Comments  °AM PM       °AM PM       °AM PM       °AM PM       °AM PM       °AM PM       °AM PM       °AM PM       °Total Daily amount of Acetaminophen °Do not take more than  3,000 mg per day    ° ° °Day 7   ° °Time  °Name of Medication Number of pills taken  °Amount of Acetaminophen  °Pain Level  ° °Comments  °AM PM       °AM PM       °AM PM       °AM PM       °AM PM       °AM PM       °AM PM       °AM PM       °Total Daily amount of Acetaminophen °Do not take more than  3,000 mg per day    ° ° ° ° °For additional information about how and where to safely dispose of unused opioid °medications - https://www.morepowerfulnc.org ° °Disclaimer: This document  contains information and/or instructional materials adapted from [facility] for the typical patient with your condition. It does not replace medical advice from your health care provider because your experience may differ from that of the °typical patient. Talk to your health care provider if you have any questions about this °document, your condition or your treatment plan. °Adapted from [facility] ° °

## 2023-04-24 NOTE — Progress Notes (Signed)
1 Day Post-Op  Subjective: Patient looks good today.  Laying in bed and in a good mood.  Foley out, no voiding yet.  Says he has passed flatus, but unsure if this is true.  He's very hungry and ate all of his CLD.   Objective: Vital signs in last 24 hours: Temp:  [97.5 F (36.4 C)-98.9 F (37.2 C)] 97.5 F (36.4 C) (05/10 0819) Pulse Rate:  [52-90] 57 (05/10 0819) Resp:  [12-18] 18 (05/10 0819) BP: (99-140)/(51-89) 99/52 (05/10 0819) SpO2:  [94 %-100 %] 100 % (05/10 0819) Weight:  [44 kg] 44 kg (05/09 1015) Last BM Date : 04/22/23  Intake/Output from previous day: 05/09 0701 - 05/10 0700 In: 2745.5 [P.O.:480; I.V.:2140.5; IV Piggyback:125] Out: 875 [Urine:800; Blood:75] Intake/Output this shift: No intake/output data recorded.  PE: Abd: soft, minimally tender, ND, incisions c/d/I  Lab Results:  Recent Labs    04/23/23 0002 04/24/23 0514  WBC 10.3 11.3*  HGB 14.9 12.2*  HCT 44.7 37.8*  PLT 97* 119*   BMET Recent Labs    04/23/23 0002 04/24/23 0514  NA 137 135  K 4.2 4.1  CL 97* 101  CO2 30 26  GLUCOSE 108* 95  BUN 31* 17  CREATININE 0.85 0.64  CALCIUM 9.6 8.5*   PT/INR No results for input(s): "LABPROT", "INR" in the last 72 hours. CMP     Component Value Date/Time   NA 135 04/24/2023 0514   K 4.1 04/24/2023 0514   CL 101 04/24/2023 0514   CO2 26 04/24/2023 0514   GLUCOSE 95 04/24/2023 0514   BUN 17 04/24/2023 0514   CREATININE 0.64 04/24/2023 0514   CALCIUM 8.5 (L) 04/24/2023 0514   PROT 8.6 (H) 04/23/2023 0002   ALBUMIN 3.6 04/23/2023 0002   AST 24 04/23/2023 0002   ALT 17 04/23/2023 0002   ALKPHOS 78 04/23/2023 0002   BILITOT 0.7 04/23/2023 0002   GFRNONAA >60 04/24/2023 0514   GFRAA NOT CALCULATED 05/05/2018 2020   Lipase  No results found for: "LIPASE"     Studies/Results: CT Renal Stone Study  Result Date: 04/23/2023 CLINICAL DATA:  Flank pain EXAM: CT ABDOMEN AND PELVIS WITHOUT CONTRAST TECHNIQUE: Multidetector CT imaging  of the abdomen and pelvis was performed following the standard protocol without IV contrast. RADIATION DOSE REDUCTION: This exam was performed according to the departmental dose-optimization program which includes automated exposure control, adjustment of the mA and/or kV according to patient size and/or use of iterative reconstruction technique. COMPARISON:  05/05/2018 FINDINGS: Lower chest: No acute abnormality. Hepatobiliary: No focal liver abnormality is seen. No gallstones, gallbladder wall thickening, or biliary dilatation. Pancreas: Unremarkable. No pancreatic ductal dilatation or surrounding inflammatory changes. Spleen: Normal in size without focal abnormality. Adrenals/Urinary Tract: Adrenal glands are within normal limits. Kidneys demonstrate no renal calculi or obstructive changes. Ureters are within normal limits. Bladder is well distended. Stomach/Bowel: No obstructive or inflammatory changes of the colon are seen. The appendix is not discretely visualized. Inflammatory changes are noted in the right lower quadrant without discrete abscess. Proximal to this there is dilatation of the small bowel suspicious for a partial small bowel obstruction likely reactive to the inflammatory changes in the right lower quadrant. More proximal small bowel and stomach appear within normal limits. Vascular/Lymphatic: No significant vascular findings are present. No enlarged abdominal or pelvic lymph nodes. Reproductive: Prostate is unremarkable. Other: Minimal free fluid is noted within the abdomen consistent with the CSF shunt. Shunt catheter is noted in the  right upper quadrant with a small amount of fluid surrounding the liver. Musculoskeletal: No acute or significant osseous findings. IMPRESSION: Constellation of findings suggestive of acute appendicitis with inflammatory changes in the right lower quadrant and a likely reactive partial small bowel obstruction. No other focal abnormality is noted. Electronically  Signed   By: Alcide Clever M.D.   On: 04/23/2023 02:32    Anti-infectives: Anti-infectives (From admission, onward)    Start     Dose/Rate Route Frequency Ordered Stop   04/24/23 0600  cefTRIAXone (ROCEPHIN) 2 g in sodium chloride 0.9 % 100 mL IVPB       See Hyperspace for full Linked Orders Report.   2 g 200 mL/hr over 30 Minutes Intravenous Every 24 hours 04/23/23 1314 04/27/23 0559   04/23/23 1800  metroNIDAZOLE (FLAGYL) IVPB 500 mg       See Hyperspace for full Linked Orders Report.   500 mg 100 mL/hr over 60 Minutes Intravenous Every 12 hours 04/23/23 1314 04/27/23 1759   04/23/23 0315  cefTRIAXone (ROCEPHIN) 2 g in sodium chloride 0.9 % 100 mL IVPB       See Hyperspace for full Linked Orders Report.   2 g 200 mL/hr over 30 Minutes Intravenous  Once 04/23/23 0300 04/23/23 0550   04/23/23 0315  metroNIDAZOLE (FLAGYL) IVPB 500 mg       See Hyperspace for full Linked Orders Report.   500 mg 100 mL/hr over 60 Minutes Intravenous  Once 04/23/23 0300 04/23/23 0655        Assessment/Plan POD 1, s/p lap appy for perforated appendicitis, Dr. Freida Busman 5/9 -foley out today, monitor for voiding -adv to regular diet and monitor tolerance -mobilize -cont abx therapy for 4 days post op -may be ready for DC over the weekend   FEN - regular VTE - SCDs, Lovenox 30mg  daily ID - Rocephin/Flagyl  CP Chiari malformation Cognitive deficits Optic nerve atrophy   LOS: 1 day    Letha Cape , [facility] 04/24/2023, 8:59 AM Please see Amion for pager number during day hours 7:00am-4:30pm or 7:00am -11:30am on weekends

## 2023-04-25 NOTE — Progress Notes (Signed)
Patients caregiver Jonetta Speak came to visit. Encouraged patient to eat so he could come home. She stated whenever he is ready just to contact her and she will be able to come and get him. Informed her of possible discharge on Monday per chart.

## 2023-04-25 NOTE — Progress Notes (Signed)
Mobility Specialist - Progress Note   04/25/23 0937  Mobility  Activity Ambulated with assistance in hallway  Level of Assistance Contact guard assist, steadying assist  Assistive Device Front wheel walker  Distance Ambulated (ft) 265 ft  Activity Response Tolerated well  Mobility Referral Yes  $Mobility charge 1 Mobility  Mobility Specialist Start Time (ACUTE ONLY) [id number]  Mobility Specialist Stop Time (ACUTE ONLY) 0935  Mobility Specialist Time Calculation (min) (ACUTE ONLY) 14 min   Pt received in bed and agreeable to mobility. No complaints during session. Pt to bed after session with all needs met.    [facility]

## 2023-04-25 NOTE — Progress Notes (Signed)
Patient ID: Aaron Heath, male   DOB: 10-28-2001, 22 y.o.   MRN: [id number]   Acute Care Surgery Service Progress Note:    Chief Complaint/Subjective: Says yes to most of all of my questions - reports a BM but none documented and pt care tech unaware of any bowel function Also pt care tech reports pt not eating much   Objective: Vital signs in last 24 hours: Temp:  [97.6 F (36.4 C)-98.4 F (36.9 C)] 98.4 F (36.9 C) (05/11 0541) Pulse Rate:  [63-93] 66 (05/11 0541) Resp:  [18] 18 (05/11 0541) BP: (120-153)/(66-85) 123/66 (05/11 0541) SpO2:  [100 %] 100 % (05/11 0541) Last BM Date : 04/22/23  Intake/Output from previous day: 05/10 0701 - 05/11 0700 In: 1761.2 [P.O.:120; I.V.:1248.8; IV Piggyback:392.4] Out: 0  Intake/Output this shift: No intake/output data recorded.  Lungs: , nonlabored  Cardiovascular: reg  Abd: a little firm/full - not really distended   Extremities: no edema, +SCDs  Neuro: alert, nonfocal  Lab Results: CBC  Recent Labs    04/23/23 0002 04/24/23 0514  WBC 10.3 11.3*  HGB 14.9 12.2*  HCT 44.7 37.8*  PLT 97* 119*   BMET Recent Labs    04/23/23 0002 04/24/23 0514  NA 137 135  K 4.2 4.1  CL 97* 101  CO2 30 26  GLUCOSE 108* 95  BUN 31* 17  CREATININE 0.85 0.64  CALCIUM 9.6 8.5*   LFT    Latest Ref Rng & Units 04/23/2023   12:02 AM 02/22/2022   10:00 PM 05/05/2018    8:20 PM  Hepatic Function  Total Protein 6.5 - 8.1 g/dL 8.6  7.4  6.6   Albumin 3.5 - 5.0 g/dL 3.6  3.4  3.7   AST 15 - 41 U/L 24  31  38   ALT 0 - 44 U/L 17  15  20    Alk Phosphatase 38 - 126 U/L 78  71  253   Total Bilirubin 0.3 - 1.2 mg/dL 0.7  0.5  0.5    PT/INR No results for input(s): "LABPROT", "INR" in the last 72 hours. ABG No results for input(s): "PHART", "HCO3" in the last 72 hours.  Invalid input(s): "PCO2", "PO2"  Studies/Results:  Anti-infectives: Anti-infectives (From admission, onward)    Start     Dose/Rate Route Frequency Ordered  Stop   04/24/23 0600  cefTRIAXone (ROCEPHIN) 2 g in sodium chloride 0.9 % 100 mL IVPB       See Hyperspace for full Linked Orders Report.   2 g 200 mL/hr over 30 Minutes Intravenous Every 24 hours 04/23/23 1314 04/27/23 0559   04/23/23 1800  metroNIDAZOLE (FLAGYL) IVPB 500 mg       See Hyperspace for full Linked Orders Report.   500 mg 100 mL/hr over 60 Minutes Intravenous Every 12 hours 04/23/23 1314 04/27/23 1759   04/23/23 0315  cefTRIAXone (ROCEPHIN) 2 g in sodium chloride 0.9 % 100 mL IVPB       See Hyperspace for full Linked Orders Report.   2 g 200 mL/hr over 30 Minutes Intravenous  Once 04/23/23 0300 04/23/23 0550   04/23/23 0315  metroNIDAZOLE (FLAGYL) IVPB 500 mg       See Hyperspace for full Linked Orders Report.   500 mg 100 mL/hr over 60 Minutes Intravenous  Once 04/23/23 0300 04/23/23 0655       Medications: Scheduled Meds:  acetaminophen  650 mg Oral QID   ARIPiprazole  7.5 mg Oral BID  divalproex  500 mg Oral Q12H   enoxaparin (LOVENOX) injection  30 mg Subcutaneous Q24H   guanFACINE  2 mg Oral QHS   levothyroxine  50 mcg Oral Q0600   methocarbamol  500 mg Oral TID   Continuous Infusions:  cefTRIAXone (ROCEPHIN)  IV 2 g (04/25/23 0534)   And   metronidazole 500 mg (04/24/23 1700)   lactated ringers 50 mL/hr at 04/24/23 1020   PRN Meds:.hydrOXYzine, iohexol, melatonin, morphine injection, ondansetron (ZOFRAN) IV, oxyCODONE  Assessment/Plan: Patient Active Problem List   Diagnosis Date Noted   Perforated appendicitis 04/23/2023   Health care maintenance 11/21/2020   Poor weight gain in adult 08/25/2020   Seasonal allergies 07/27/2020   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 Freida Busman 5/9  -cont regular diet and monitor tolerance -mobilize -cont abx therapy for 4 days post op    FEN - regular VTE - SCDs, Lovenox 30mg  daily ID - Rocephin/Flagyl   CP Chiari malformation Cognitive deficits Optic nerve atrophy Disposition: needs to have BM prior to dc, not eating much; monitor for ileus  LOS: 2 days    Mary Sella. Andrey Campanile, MD, FACS General, Bariatric, & Minimally Invasive Surgery [phone] [facility], [facility]

## 2023-04-25 NOTE — Progress Notes (Signed)
Attempted to call patients legal guardian Aaron Heath per multiple patient requests. Unable to reach him only got voicemail. Did not leave a message at this time.

## 2023-04-26 ENCOUNTER — Inpatient Hospital Stay (HOSPITAL_COMMUNITY): Payer: Medicare Other

## 2023-04-26 MED ORDER — POLYETHYLENE GLYCOL 3350 17 G PO PACK
17.0000 g | PACK | Freq: Once | ORAL | Status: AC
  Administered 2023-04-26: 17 g via ORAL
  Filled 2023-04-26: qty 1

## 2023-04-26 NOTE — Progress Notes (Addendum)
Patient ID: Aaron Heath, male   DOB: [birth year], 22 y.o.   MRN: [id number]   Acute Care Surgery Service Progress Note:    Chief Complaint/Subjective: Says yes to most of all of my questions -  No BM documented. Denies n/v.  Also pt care tech reports pt continuing not eating much   Objective: Vital signs in last 24 hours: Temp:  [97.5 F (36.4 C)-98.1 F (36.7 C)] 98 F (36.7 C) (05/12 0509) Pulse Rate:  [58-78] 58 (05/12 0509) Resp:  [16-18] 16 (05/12 0509) BP: (114-124)/(55-76) 114/60 (05/12 0509) SpO2:  [96 %-100 %] 100 % (05/12 0509) Last BM Date : 04/22/23  Intake/Output from previous day: 05/11 0701 - 05/12 0700 In: 1776.3 [P.O.:500; I.V.:1060.8; IV Piggyback:95.5] Out: 100 [Urine:100] Intake/Output this shift: No intake/output data recorded.  Lungs: , nonlabored  Cardiovascular: reg  Abd: a little firm/full - not really distended; incisions ok  Extremities: no edema, +SCDs  Neuro: alert, nonfocal  Lab Results: CBC  Recent Labs    04/24/23 0514  WBC 11.3*  HGB 12.2*  HCT 37.8*  PLT 119*    BMET Recent Labs    04/24/23 0514  NA 135  K 4.1  CL 101  CO2 26  GLUCOSE 95  BUN 17  CREATININE 0.64  CALCIUM 8.5*    LFT    Latest Ref Rng & Units 04/23/2023   12:02 AM 02/22/2022   10:00 PM 05/05/2018    8:20 PM  Hepatic Function  Total Protein 6.5 - 8.1 g/dL 8.6  7.4  6.6   Albumin 3.5 - 5.0 g/dL 3.6  3.4  3.7   AST 15 - 41 U/L 24  31  38   ALT 0 - 44 U/L 17  15  20    Alk Phosphatase 38 - 126 U/L 78  71  253   Total Bilirubin 0.3 - 1.2 mg/dL 0.7  0.5  0.5    PT/INR No results for input(s): "LABPROT", "INR" in the last 72 hours. ABG No results for input(s): "PHART", "HCO3" in the last 72 hours.  Invalid input(s): "PCO2", "PO2"  Studies/Results:  Anti-infectives: Anti-infectives (From admission, onward)    Start     Dose/Rate Route Frequency Ordered Stop   04/24/23 0600  cefTRIAXone (ROCEPHIN) 2 g in sodium chloride 0.9 % 100 mL  IVPB       See Hyperspace for full Linked Orders Report.   2 g 200 mL/hr over 30 Minutes Intravenous Every 24 hours 04/23/23 1314 04/26/23 0650   04/23/23 1800  metroNIDAZOLE (FLAGYL) IVPB 500 mg       See Hyperspace for full Linked Orders Report.   500 mg 100 mL/hr over 60 Minutes Intravenous Every 12 hours 04/23/23 1314 04/27/23 1759   04/23/23 0315  cefTRIAXone (ROCEPHIN) 2 g in sodium chloride 0.9 % 100 mL IVPB       See Hyperspace for full Linked Orders Report.   2 g 200 mL/hr over 30 Minutes Intravenous  Once 04/23/23 0300 04/23/23 0550   04/23/23 0315  metroNIDAZOLE (FLAGYL) IVPB 500 mg       See Hyperspace for full Linked Orders Report.   500 mg 100 mL/hr over 60 Minutes Intravenous  Once 04/23/23 0300 04/23/23 0655       Medications: Scheduled Meds:  acetaminophen  650 mg Oral QID   ARIPiprazole  7.5 mg Oral BID   divalproex  500 mg Oral Q12H   enoxaparin (LOVENOX) injection  30 mg Subcutaneous Q24H  guanFACINE  2 mg Oral QHS   levothyroxine  50 mcg Oral Q0600   methocarbamol  500 mg Oral TID   Continuous Infusions:  lactated ringers 50 mL/hr at 04/25/23 1600   metronidazole 500 mg (04/26/23 0505)   PRN Meds:.hydrOXYzine, iohexol, melatonin, morphine injection, ondansetron (ZOFRAN) IV, oxyCODONE  Assessment/Plan: Patient Active Problem List   Diagnosis Date Noted   Perforated appendicitis 04/23/2023   Health care maintenance 11/21/2020   Poor weight gain in adult 08/25/2020   Seasonal allergies 07/27/2020   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 35-49) 04/18/2013   Thyroiditis, autoimmune 04/18/2013   Congenital hydrocephalus (HCC) 04/18/2013   Other specified infantile cerebral palsy 04/18/2013   Delayed linear growth 02/15/2013   Low IGF-1 level 02/15/2013   Hypothyroidism, acquired, autoimmune 02/14/2013   Goiter 02/14/2013   Hypoalbuminemia 02/14/2013   Thrombocytopenia, unspecified (HCC)  02/11/2013   Exotropia of both eyes 09/07/2012   Hypertropia 09/07/2012   s/p Procedure(s): APPENDECTOMY LAPAROSCOPIC 04/23/2023  POD 3, s/p lap appy for perforated appendicitis, Dr. Freida Busman 5/9  -cont regular diet and monitor tolerance -mobilize -cont abx therapy for 4 days post op    FEN - regular but appears pt really not eating; cont IVF since appears not eating; will check lytes in am VTE - SCDs, Lovenox 30mg  daily ID - Rocephin/Flagyl   CP Chiari malformation Cognitive deficits Optic nerve atrophy Disposition: needs to have BM prior to dc, not eating much; monitor for ileus; will check xray of abd to look at bowel gas pattern.   LOS: 3 days    Mary Sella. Andrey Campanile, MD, FACS General, Bariatric, & Minimally Invasive Surgery [phone] [facility] Surgery, A [facility]

## 2023-04-26 NOTE — Plan of Care (Signed)
  Problem: Education: Goal: Knowledge of General Education information will improve Description Including pain rating scale, medication(s)/side effects and non-pharmacologic comfort measures Outcome: Progressing   Problem: Health Behavior/Discharge Planning: Goal: Ability to manage health-related needs will improve Outcome: Progressing   

## 2023-04-27 LAB — BASIC METABOLIC PANEL
Anion gap: 8 (ref 5–15)
BUN: 10 mg/dL (ref 6–20)
CO2: 26 mmol/L (ref 22–32)
Calcium: 8.4 mg/dL — ABNORMAL LOW (ref 8.9–10.3)
Chloride: 101 mmol/L (ref 98–111)
Creatinine, Ser: 0.51 mg/dL — ABNORMAL LOW (ref 0.61–1.24)
GFR, Estimated: >60 mL/min (ref >60)
Glucose, Bld: 82 mg/dL (ref 70–99)
Potassium: 3.4 mmol/L — ABNORMAL LOW (ref 3.5–5.1)
Sodium: 135 mmol/L (ref 135–145)

## 2023-04-27 MED ORDER — BISACODYL 10 MG RE SUPP
10.0000 mg | Freq: Once | RECTAL | Status: AC
  Administered 2023-04-27: 10 mg via RECTAL
  Filled 2023-04-27: qty 1

## 2023-04-27 MED ORDER — OXYCODONE HCL 5 MG PO TABS: 5.0000 mg | ORAL_TABLET | ORAL | 0 refills | Status: DC | PRN

## 2023-04-27 MED ORDER — ACETAMINOPHEN 325 MG PO TABS: 650.0000 mg | ORAL_TABLET | Freq: Four times a day (QID) | ORAL | Status: AC | PRN

## 2023-04-27 MED ORDER — ENSURE ENLIVE PO LIQD
237.0000 mL | Freq: Two times a day (BID) | ORAL | Status: DC
  Administered 2023-04-27: 237 mL via ORAL

## 2023-04-27 MED ORDER — POLYETHYLENE GLYCOL 3350 17 G PO PACK
17.0000 g | PACK | Freq: Every day | ORAL | Status: DC
  Administered 2023-04-27: 17 g via ORAL
  Filled 2023-04-27: qty 1

## 2023-04-27 NOTE — Progress Notes (Signed)
Discharge instructions gone over with patients caretaker Judi Cong and all questions were answered.

## 2023-04-27 NOTE — Progress Notes (Signed)
Mobility Specialist - Progress Note   04/27/23 1217  Mobility  Activity Ambulated with assistance in hallway  Level of Assistance Contact guard assist, steadying assist  Assistive Device Front wheel walker  Distance Ambulated (ft) 350 ft  Range of Motion/Exercises Active  Activity Response Tolerated well  Mobility Referral Yes  $Mobility charge 1 Mobility  Mobility Specialist Start Time (ACUTE ONLY) 1211  Mobility Specialist Stop Time (ACUTE ONLY) 1217  Mobility Specialist Time Calculation (min) (ACUTE ONLY) 6 min   Pt received in hallway walking with NT, relieved tech and continued ambulation session. Contact during session due to some unsteadiness.   Pt returned to chair with all needs met and alarm on.  Marilynne Halsted Mobility Specialist

## 2023-04-27 NOTE — Care Management Important Message (Signed)
Important Message  Patient Details IM Letter placed in room. Name: Aaron Heath MRN: [id number] Date of Birth: 02-Dec-2001   Medicare Important Message Given:  Yes     Caren Macadam 04/27/2023, 12:13 PM

## 2023-04-27 NOTE — Progress Notes (Signed)
4 Days Post-Op  Subjective: Patient looks good today.  Laying in bed and in a good mood.  Foley out, no voiding yet.  Not eating much, no BM yet.  Wants to get up and walk   Objective: Vital signs in last 24 hours: Temp:  [98.4 F (36.9 C)-98.6 F (37 C)] 98.6 F (37 C) (05/13 0508) Pulse Rate:  [61-81] 66 (05/13 0508) Resp:  [16-18] 18 (05/13 0508) BP: (113-123)/(49-68) 116/62 (05/13 0508) SpO2:  [97 %-100 %] 100 % (05/13 0508) Last BM Date : 04/22/23  Intake/Output from previous day: 05/12 0701 - 05/13 0700 In: 2200.1 [P.O.:600; I.V.:1350; IV Piggyback:250.1] Out: 0  Intake/Output this shift: Total I/O In: 120 [P.O.:120] Out: -   PE: Abd: soft, minimally tender, ND, incisions c/d/I  Lab Results:  No results for input(s): "WBC", "HGB", "HCT", "PLT" in the last 72 hours.  BMET Recent Labs    04/27/23 0425  NA 135  K 3.4*  CL 101  CO2 26  GLUCOSE 82  BUN 10  CREATININE 0.51*  CALCIUM 8.4*   PT/INR No results for input(s): "LABPROT", "INR" in the last 72 hours. CMP     Component Value Date/Time   NA 135 04/27/2023 0425   K 3.4 (L) 04/27/2023 0425   CL 101 04/27/2023 0425   CO2 26 04/27/2023 0425   GLUCOSE 82 04/27/2023 0425   BUN 10 04/27/2023 0425   CREATININE 0.51 (L) 04/27/2023 0425   CALCIUM 8.4 (L) 04/27/2023 0425   PROT 8.6 (H) 04/23/2023 0002   ALBUMIN 3.6 04/23/2023 0002   AST 24 04/23/2023 0002   ALT 17 04/23/2023 0002   ALKPHOS 78 04/23/2023 0002   BILITOT 0.7 04/23/2023 0002   GFRNONAA >60 04/27/2023 0425   GFRAA NOT CALCULATED 05/05/2018 2020   Lipase  No results found for: "LIPASE"     Studies/Results: DG Abd 1 View  Result Date: 04/26/2023 CLINICAL DATA:  Distention. EXAM: ABDOMEN - 1 VIEW COMPARISON:  May 05, 2018 KUB. CT of the abdomen and pelvis Apr 23, 2023. FINDINGS: Fecal loading in the ascending and descending colon. Surgical clips in the right lower quadrant. Prominent air-filled loop of colon in the central upper  abdomen, likely in the sigmoid colon, similar in appearance since 2019. No convincing evidence of obstruction. IMPRESSION: 1. Fecal loading in the ascending and descending colon. 2. Prominent air-filled loop of colon in the central upper abdomen, likely in the sigmoid colon, similar in appearance since 2019. No convincing evidence of obstruction. Electronically Signed   By: Gerome Sam III M.D.   On: 04/26/2023 10:24    Anti-infectives: Anti-infectives (From admission, onward)    Start     Dose/Rate Route Frequency Ordered Stop   04/24/23 0600  cefTRIAXone (ROCEPHIN) 2 g in sodium chloride 0.9 % 100 mL IVPB       See Hyperspace for full Linked Orders Report.   2 g 200 mL/hr over 30 Minutes Intravenous Every 24 hours 04/23/23 1314 04/26/23 0650   04/23/23 1800  metroNIDAZOLE (FLAGYL) IVPB 500 mg       See Hyperspace for full Linked Orders Report.   500 mg 100 mL/hr over 60 Minutes Intravenous Every 12 hours 04/23/23 1314 04/27/23 1759   04/23/23 0315  cefTRIAXone (ROCEPHIN) 2 g in sodium chloride 0.9 % 100 mL IVPB       See Hyperspace for full Linked Orders Report.   2 g 200 mL/hr over 30 Minutes Intravenous  Once 04/23/23 0300 04/23/23  1610   04/23/23 0315  metroNIDAZOLE (FLAGYL) IVPB 500 mg       See Hyperspace for full Linked Orders Report.   500 mg 100 mL/hr over 60 Minutes Intravenous  Once 04/23/23 0300 04/23/23 0655        Assessment/Plan POD 4, s/p lap appy for perforated appendicitis, Dr. Freida Busman 5/9 -regular diet, add ensure between meals,  -mobilize -cont abx therapy for 4 days post op, stop today -miralax daily, suppository today. -hopefully home once has bowel function  FEN - regular VTE - SCDs, Lovenox 30mg  daily ID - Rocephin/Flagyl, stop today  CP Chiari malformation Cognitive deficits Optic nerve atrophy   LOS: 4 days    Letha Cape , [facility] Surgery 04/27/2023, 9:47 AM Please see Amion for pager number during day hours  7:00am-4:30pm or 7:00am -11:30am on weekends

## 2023-04-27 NOTE — Discharge Summary (Signed)
Patient ID: Aaron Heath [id number] Nov 16, 2001 22 y.o.  Admit date: 04/22/2023 Discharge date: 04/27/2023  Admitting Diagnosis: Acute appendicitis  Discharge Diagnosis Patient Active Problem List   Diagnosis Date Noted   Perforated appendicitis 04/23/2023   Health care maintenance 11/21/2020   Poor weight gain in adult 08/25/2020   Seasonal allergies 07/27/2020   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 35-49) 04/18/2013   Thyroiditis, autoimmune 04/18/2013   Congenital hydrocephalus (HCC) 04/18/2013   Other specified infantile cerebral palsy 04/18/2013   Delayed linear growth 02/15/2013   Low IGF-1 level 02/15/2013   Hypothyroidism, acquired, autoimmune 02/14/2013   Goiter 02/14/2013   Hypoalbuminemia 02/14/2013   Thrombocytopenia, unspecified (HCC) 02/11/2013   Exotropia of both eyes 09/07/2012   Hypertropia 09/07/2012    Consultants none  Reason for Admission: Aaron Heath is a 22 y/o M with a PMH CP, developmental delay, seizures, and VP shunt placement who was evaluated this morning at [facility] and found to have acute appendicitis. During my exam the patient is alone, his caretaker just left the hospital.    I called his caretaker, Aaron Heath, who has been taking care of this patient for 5 years. She tells me that she noticed something was abnormal on Tuesday, 2 days ago, when he was walking a little slumped over. Otherwise he had a normal day, went to school. Yesterday he went to school where he had two episodes of incontinence of stool, which is atypical for him. He also had an episode of vomiting along with decreased oral intake, this prompted [name] to take him to [facility] for further evaluation. She tells me that he has not had a seizure in over 5 years.   Procedures Lap appy for acute perforated appendicitis, Dr. Freida Busman 04/23/23  Hospital Course:  The patient was  admitted and started on IV abx therapy and taken to the OR for the above procedure.  He was noted to have a perforation and his abx were continued for 4 days post op.  His diet was able to be advanced as tolerated but initially he didn't eat very much.  This improved and on POD 4, he was moving his bowels and his oral intake had improved.  He was otherwise stable and ready for DC home.   Allergies as of 04/27/2023   No Known Allergies      Medication List     STOP taking these medications    guanFACINE 1 MG tablet Commonly known as: TENEX       TAKE these medications    acetaminophen 325 MG tablet Commonly known as: TYLENOL Take 2 tablets (650 mg total) by mouth every 6 (six) hours as needed.   Animal Shapes Chew TAKE 1 VITAMIN BY MOUTH DAILY.   ARIPiprazole 5 MG tablet Commonly known as: ABILIFY Take 7.5 mg by mouth in the morning and at bedtime.   cetirizine 10 MG tablet Commonly known as: ZYRTEC TAKE 1 TABLET BY MOUTH (=TO 10MG ) ONCE DAILY.   cholecalciferol 25 MCG (1000 UNIT) tablet Commonly known as: VITAMIN D3 Take 1 tablet (1,000 Units total) by mouth daily.   divalproex 500 MG DR tablet Commonly known as: DEPAKOTE Take 500 mg by mouth in the morning and at bedtime.   feeding supplement Liqd Take 237 mLs by mouth 2 (two) times daily between meals.   fluticasone 50 MCG/ACT nasal spray Commonly known as: FLONASE PLACE 2  SPRAYS INTO BOTH NOSTRILS DAILY.   guanFACINE 2 MG Tb24 ER tablet Commonly known as: INTUNIV Take 2 mg by mouth at bedtime.   hydrOXYzine 25 MG tablet Commonly known as: ATARAX Take 25 mg by mouth 3 (three) times daily as needed. Twice daily and once as needed   levothyroxine 50 MCG tablet Commonly known as: SYNTHROID TAKE 1 TABLET BY MOUTH ONCE DAILY BEFORE BREAKFAST.   melatonin 3 MG Tabs tablet Take 1 tablet (3 mg total) by mouth at bedtime.   multivitamin-iron-minerals-folic acid chewable tablet Chew 1 tablet by mouth daily.    oxyCODONE 5 MG immediate release tablet Commonly known as: Oxy IR/ROXICODONE Take 1 tablet (5 mg total) by mouth every 4 (four) hours as needed for severe pain.   polyethylene glycol 17 g packet Commonly known as: MIRALAX / GLYCOLAX MIX 17 GRAMS IN LIQUID AND DRINK ONCE DAILY.          Follow-up Information     Fritzi Mandes, MD Follow up on 05/18/2023.   Specialty: General Surgery Why: 9:15 am, Arrive 30 minutes prior to your appointment time, Please bring your insurance card and photo ID Contact information: [address] [place] [address] [phone]         Cora Collum, DO Follow up.   Specialty: Family Medicine Why: As needed Contact information: [address] [place] [address] [phone]                 Signed: Barnetta Chapel, [facility] Surgery 04/27/2023, 12:42 PM Please see Amion for pager number during day hours 7:00am-4:30pm, 7-11:30am on Weekends

## 2023-04-28 ENCOUNTER — Telehealth: Payer: Self-pay

## 2023-04-28 NOTE — Transitions of Care (Post Inpatient/ED Visit) (Addendum)
04/28/2023  Name: Thompson Gato MRN: [id number] DOB: 08-12-2001  Today's TOC FU Call Status: Today's TOC FU Call Status:: Successful TOC FU Call Competed TOC FU Call Complete Date: 04/28/23  Transition Care Management Follow-up Telephone Call Date of Discharge: 04/27/23 Discharge Facility: [facility]) Type of Discharge: Inpatient Admission Primary Inpatient Discharge Diagnosis:: Acute Appendicitis How have you been since you were released from the hospital?: Better (Per patients caregiver [name], he is eating well and if feeling better) Any questions or concerns?: No  Items Reviewed: Did you receive and understand the discharge instructions provided?: Yes Medications obtained,verified, and reconciled?: Yes (Medications Reviewed) Any new allergies since your discharge?: No Dietary orders reviewed?: No Do you have support at home?: Yes People in Home: facility resident Name of Support/Comfort Primary Source: Caregiver- [name]  Medications Reviewed Today: Medications Reviewed Today     Reviewed by Jodelle Gross, RN (Case Manager) on 04/28/23 at 1108  Med List Status: <None>   Medication Order Taking? Sig Documenting Provider Last Dose Status Informant  acetaminophen (TYLENOL) 325 MG tablet [id number] Yes Take 2 tablets (650 mg total) by mouth every 6 (six) hours as needed. Barnetta Chapel, PA-C Taking Active   ARIPiprazole (ABILIFY) 5 MG tablet [id number] Yes Take 7.5 mg by mouth in the morning and at bedtime. [provider] Taking Active Care Giver  cetirizine (ZYRTEC) 10 MG tablet [id number] No TAKE 1 TABLET BY MOUTH (=TO 10MG ) ONCE DAILY. Cora Collum, DO Unknown Active Care Giver  cholecalciferol 25 MCG (1000 UT) tablet [id number] No Take 1 tablet (1,000 Units total) by mouth daily. Cora Collum, DO Unknown Active Care Giver  divalproex (DEPAKOTE) 500 MG DR tablet [id number] Yes Take 500 mg by mouth in the morning and at bedtime.  [provider] Taking Active Care Giver  feeding supplement (ENSURE ENLIVE / ENSURE PLUS) LIQD [id number] Yes Take 237 mLs by mouth 2 (two) times daily between meals. Cora Collum, DO Taking Active Care Giver  fluticasone [facility]) 50 MCG/ACT nasal spray [id number] Yes PLACE 2 SPRAYS INTO BOTH NOSTRILS DAILY. Cora Collum, DO Taking Active Care Giver  guanFACINE (INTUNIV) 2 MG TB24 ER tablet [id number] Yes Take 2 mg by mouth at bedtime. [provider] Taking Active Care Giver  hydrOXYzine (ATARAX/VISTARIL) 25 MG tablet [id number] Yes Take 25 mg by mouth 3 (three) times daily as needed. Twice daily and once as needed [provider] Taking Active Care Giver  levothyroxine (SYNTHROID) 50 MCG tablet [id number] Yes TAKE 1 TABLET BY MOUTH ONCE DAILY BEFORE BREAKFAST. Cora Collum, DO Taking Active Care Giver  melatonin 3 MG TABS tablet [id number] No Take 1 tablet (3 mg total) by mouth at bedtime. Cora Collum, DO Unknown Active Care Giver           Med Note Cyndie Chime, [place] [facility]   Thu Apr 23, 2023  5:22 AM)    multivitamin-iron-minerals-folic acid (CENTRUM) chewable tablet [id number]  Chew 1 tablet by mouth daily. Allayne Stack, DO  Active Care Giver  oxyCODONE (OXY IR/ROXICODONE) 5 MG immediate release tablet [id number] No Take 1 tablet (5 mg total) by mouth every 4 (four) hours as needed for severe pain.  Patient not taking: Reported on 04/28/2023   Barnetta Chapel, PA-C Not Taking Active            Med Note Electa Sniff [facility]   Tue Apr 28, 2023 11:08 AM) Patient taking Tylenol  Pediatric Multiple Vitamins (ANIMAL SHAPES) CHEW [id number]  TAKE 1 VITAMIN BY MOUTH DAILY. Cora Collum, DO  Active Care Giver  polyethylene glycol (MIRALAX / GLYCOLAX) 17 g packet [id number]  MIX 17 GRAMS IN LIQUID AND DRINK ONCE DAILY. Cora Collum, DO  Active Care Giver            Home Care and Equipment/Supplies: Were Home Health Services Ordered?: No Any new  equipment or medical supplies ordered?: No  Functional Questionnaire: Do you need assistance with bathing/showering or dressing?: Yes Do you need assistance with meal preparation?: Yes Do you need assistance with eating?: Yes Do you have difficulty maintaining continence: No Do you need assistance with getting out of bed/getting out of a chair/moving?: No Do you have difficulty managing or taking your medications?: Yes  Follow up appointments reviewed: PCP Follow-up appointment confirmed?: NA Specialist Hospital Follow-up appointment confirmed?: Yes Date of Specialist follow-up appointment?: 05/18/23 Follow-Up Specialty Provider:: Dr. Freida Busman (Surgeon) Do you need transportation to your follow-up appointment?: No Do you understand care options if your condition(s) worsen?: Yes-patient verbalized understanding  Interventions Today    Flowsheet Row Most Recent Value  Chronic Disease   Chronic disease during today's visit Other  [Acute Appendicitis]  Nutrition Interventions   Nutrition Discussed/Reviewed Nutrition Discussed       TOC Interventions Today    Flowsheet Row Most Recent Value  TOC Interventions   TOC Interventions Discussed/Reviewed TOC Interventions Discussed, Post op wound/incision care       SDOH Interventions Today    Flowsheet Row Most Recent Value  SDOH Interventions   Food Insecurity Interventions Intervention Not Indicated  Housing Interventions Intervention Not Indicated  Transportation Interventions Intervention Not Indicated  Utilities Interventions Intervention Not Indicated     Jodelle Gross, RN, BSN, CCM Care Management Coordinator [facility] Phone: [phone]/Fax: [phone]

## 2023-05-15 ENCOUNTER — Other Ambulatory Visit: Payer: Self-pay | Admitting: *Deleted

## 2023-05-17 MED ORDER — POLYETHYLENE GLYCOL 3350 17 G PO PACK: PACK | ORAL | 0 refills | Status: DC

## 2023-07-02 ENCOUNTER — Other Ambulatory Visit: Payer: Self-pay

## 2023-07-02 DIAGNOSIS — R627 Adult failure to thrive: Secondary | ICD-10-CM

## 2023-07-02 DIAGNOSIS — E063 Autoimmune thyroiditis: Secondary | ICD-10-CM

## 2023-07-02 MED ORDER — LEVOTHYROXINE SODIUM 50 MCG PO TABS: 50.0000 ug | ORAL_TABLET | Freq: Every day | ORAL | 0 refills | Status: DC

## 2023-07-02 MED ORDER — VITAMIN D3 25 MCG (1000 UNIT) PO TABS: 1000.0000 [IU] | ORAL_TABLET | Freq: Every day | ORAL | 3 refills | Status: DC

## 2023-07-02 MED ORDER — ANIMAL SHAPES PO CHEW: CHEWABLE_TABLET | ORAL | 3 refills | Status: DC

## 2023-08-12 ENCOUNTER — Other Ambulatory Visit: Payer: Self-pay

## 2023-08-12 DIAGNOSIS — E063 Autoimmune thyroiditis: Secondary | ICD-10-CM

## 2023-08-12 DIAGNOSIS — J302 Other seasonal allergic rhinitis: Secondary | ICD-10-CM

## 2023-08-13 ENCOUNTER — Other Ambulatory Visit: Payer: Self-pay | Admitting: Student

## 2023-08-13 DIAGNOSIS — J302 Other seasonal allergic rhinitis: Secondary | ICD-10-CM

## 2023-08-13 DIAGNOSIS — E063 Autoimmune thyroiditis: Secondary | ICD-10-CM

## 2023-09-10 ENCOUNTER — Other Ambulatory Visit: Payer: Self-pay

## 2023-09-10 DIAGNOSIS — J302 Other seasonal allergic rhinitis: Secondary | ICD-10-CM

## 2023-09-10 DIAGNOSIS — E063 Autoimmune thyroiditis: Secondary | ICD-10-CM

## 2023-09-14 MED ORDER — CETIRIZINE HCL 10 MG PO TABS: 10.0000 mg | ORAL_TABLET | Freq: Every day | ORAL | 0 refills | Status: DC

## 2023-09-14 MED ORDER — LEVOTHYROXINE SODIUM 50 MCG PO TABS
50.0000 ug | ORAL_TABLET | Freq: Every day | ORAL | 0 refills | Status: DC
Start: 2023-09-14 — End: 2023-09-17

## 2023-09-16 ENCOUNTER — Other Ambulatory Visit: Payer: Self-pay | Admitting: Student

## 2023-09-16 DIAGNOSIS — E063 Autoimmune thyroiditis: Secondary | ICD-10-CM

## 2023-09-16 DIAGNOSIS — J302 Other seasonal allergic rhinitis: Secondary | ICD-10-CM

## 2023-10-12 ENCOUNTER — Other Ambulatory Visit: Payer: Self-pay

## 2023-10-12 ENCOUNTER — Encounter: Payer: Self-pay | Admitting: Student

## 2023-10-12 ENCOUNTER — Ambulatory Visit: Payer: Medicare Other | Admitting: Student

## 2023-10-12 VITALS — BP 104/36 | HR 60 | Wt 98.6 lb

## 2023-10-12 DIAGNOSIS — Q039 Congenital hydrocephalus, unspecified: Secondary | ICD-10-CM | POA: Diagnosis not present

## 2023-10-12 DIAGNOSIS — Z0000 Encounter for general adult medical examination without abnormal findings: Secondary | ICD-10-CM

## 2023-10-12 DIAGNOSIS — E063 Autoimmune thyroiditis: Secondary | ICD-10-CM | POA: Diagnosis not present

## 2023-10-12 DIAGNOSIS — Z23 Encounter for immunization: Secondary | ICD-10-CM | POA: Diagnosis present

## 2023-10-12 NOTE — Progress Notes (Signed)
    SUBJECTIVE:   Chief compliant/HPI: annual examination  Aaron Heath is a 22 y.o. who presents today for an annual exam. He is accompanied by his caregiver with whom he lives. They have been together for the past 5-6 years.   Reviewed and updated history.  Patient has a history of cerebral palsy, developmental delay, congenital hydrocephalus and history of seizure disorder status post VP shunt placement.  He also has congenital cortical blindness.  Autoimmune thyroiditis.  Review of systems form notable for none-feels well.    OBJECTIVE:   BP (!) 104/36   Pulse 60   Wt 98 lb 9.6 oz (44.7 kg)   SpO2 100%   BMI 16.92 kg/m   Gen: Well-appearing, talkative and in good spirits HENT: Small wart to back of head, otherwise benign Cardio: RRR, no murmur Pulm: Normal WOB on RA, lungs are clear  Abd: non-tender, non-distended  Neuro: At baseline, answers questions appropriately, ambulatory and steady  ASSESSMENT/PLAN:   Congenital hydrocephalus Remarkably stable. No seizure in about 6 years. Has psychiatric follow-up.   Hypothyroidism, acquired, autoimmune Has been several years since we've checked a TSH. Caregiver reports good adherence to his daily Synthroid. - TSH today     Annual Examination  See AVS for age appropriate recommendations  Blood pressure reviewed and at goal.  Flu shot today.   Follow up in 1 year or sooner if indicated.    Eliezer Mccoy, MD [facility]

## 2023-10-12 NOTE — Assessment & Plan Note (Signed)
Has been several years since we've checked a TSH. Caregiver reports good adherence to his daily Synthroid. - TSH today

## 2023-10-12 NOTE — Patient Instructions (Addendum)
[name],  So great to meet you!  You seem to be doing a great job taking care of yourself.  I would like to see you continue to do sports and stay active in your community.  We are going to check your TSH today to make sure that we have you on the correct dose of levothyroxine.  Otherwise we are just giving you a flu shot and I would love to see you back in 1 year if nothing comes up before then.   Eliezer Mccoy, MD

## 2023-10-12 NOTE — Assessment & Plan Note (Signed)
Remarkably stable. No seizure in about 6 years. Has psychiatric follow-up.

## 2023-10-13 LAB — TSH: TSH: 3.38 u[IU]/mL (ref 0.450–4.500)

## 2023-10-15 ENCOUNTER — Encounter: Payer: Self-pay | Admitting: Student

## 2023-10-16 ENCOUNTER — Other Ambulatory Visit: Payer: Self-pay | Admitting: Student

## 2023-10-16 DIAGNOSIS — J302 Other seasonal allergic rhinitis: Secondary | ICD-10-CM

## 2023-10-16 DIAGNOSIS — R627 Adult failure to thrive: Secondary | ICD-10-CM

## 2023-10-20 ENCOUNTER — Other Ambulatory Visit: Payer: Self-pay | Admitting: Student

## 2023-11-10 ENCOUNTER — Other Ambulatory Visit: Payer: Self-pay | Admitting: Student

## 2023-11-10 DIAGNOSIS — R627 Adult failure to thrive: Secondary | ICD-10-CM

## 2023-11-10 DIAGNOSIS — E063 Autoimmune thyroiditis: Secondary | ICD-10-CM

## 2023-11-10 DIAGNOSIS — J302 Other seasonal allergic rhinitis: Secondary | ICD-10-CM

## 2023-12-03 ENCOUNTER — Other Ambulatory Visit: Payer: Self-pay | Admitting: Student

## 2023-12-03 DIAGNOSIS — R627 Adult failure to thrive: Secondary | ICD-10-CM

## 2023-12-03 DIAGNOSIS — J302 Other seasonal allergic rhinitis: Secondary | ICD-10-CM

## 2024-01-05 ENCOUNTER — Other Ambulatory Visit: Payer: Self-pay | Admitting: Student

## 2024-01-05 DIAGNOSIS — J302 Other seasonal allergic rhinitis: Secondary | ICD-10-CM

## 2024-01-05 DIAGNOSIS — E063 Autoimmune thyroiditis: Secondary | ICD-10-CM

## 2024-01-05 DIAGNOSIS — R627 Adult failure to thrive: Secondary | ICD-10-CM

## 2024-01-11 IMAGING — CT CT HEAD W/O CM
3 series · 15 of 47 positions shown, 18 images · non-contrast
Comparison: Comparison made with most recent CT from 05/05/2018.

CLINICAL DATA: Initial evaluation for mental status change, unknown
cause. Gait disturbance.



[Series 3: head wo · axial · 0.46mm/px · z∈[+1131,+1286]mm · 9 of 37 slices shown, 12 images]
[im 3/37  brain]
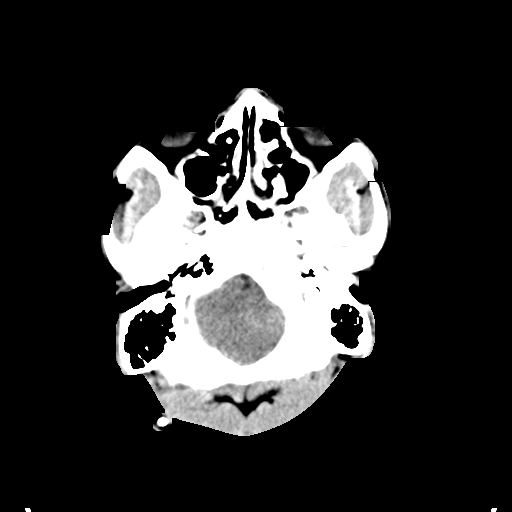
[im 3/37  bone]
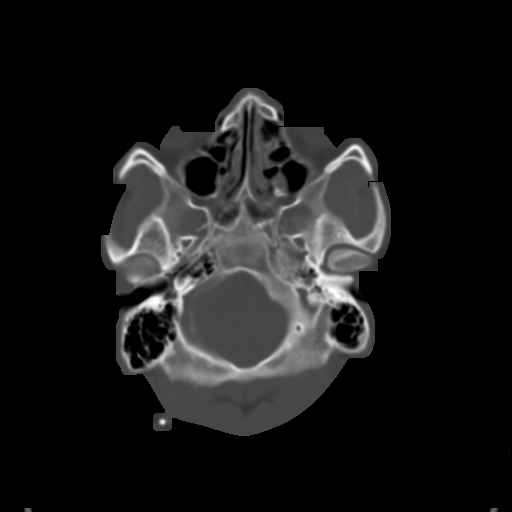
[im 7/37  brain]
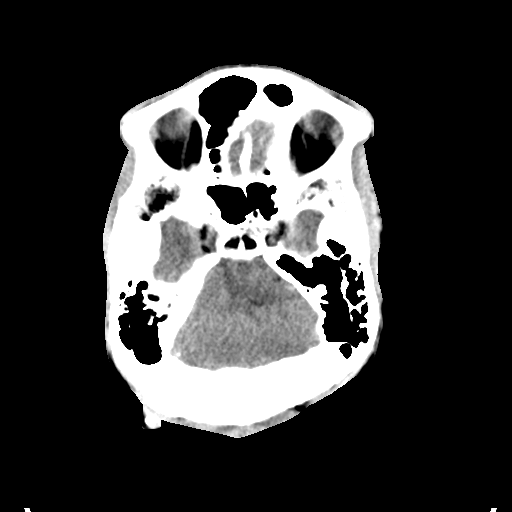
[im 10/37  brain]
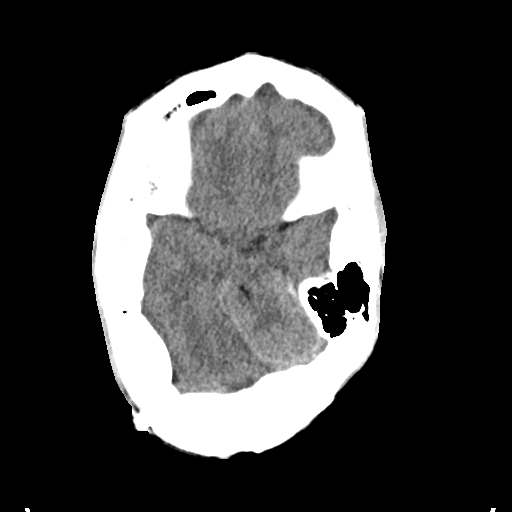
[im 14/37  brain]
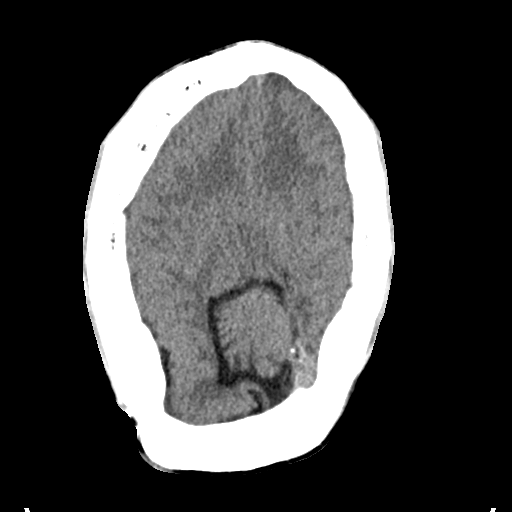
[im 19/37  brain]
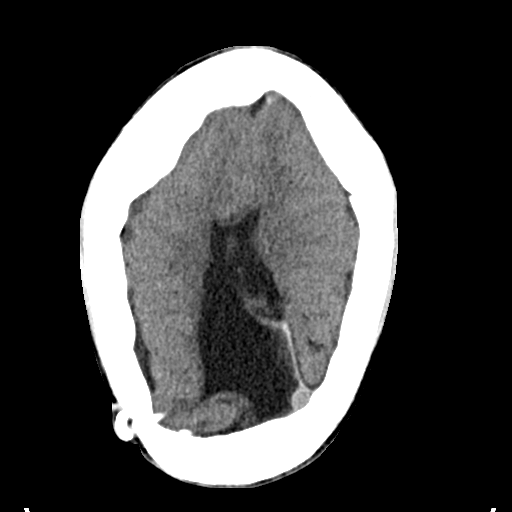
[im 19/37  bone]
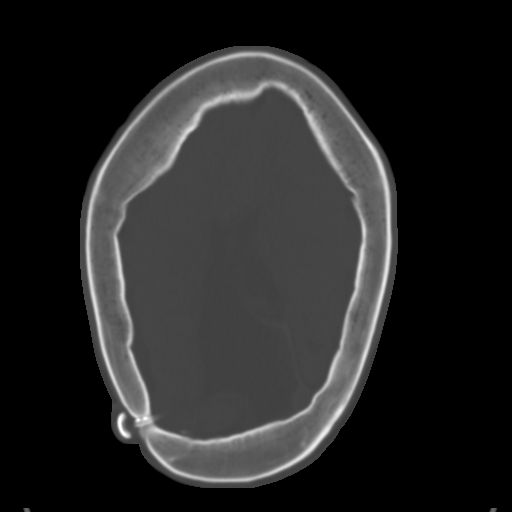
[im 23/37  brain]
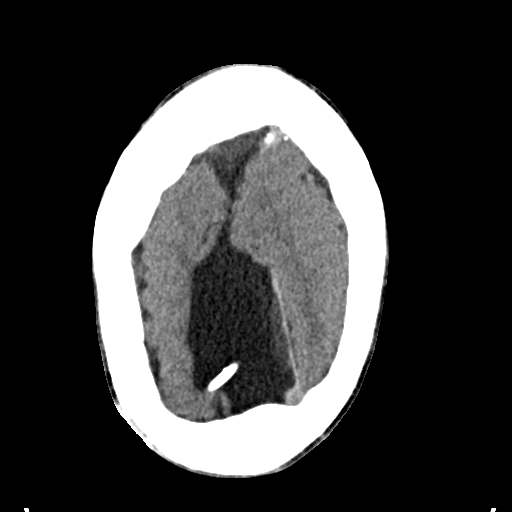
[im 27/37  brain]
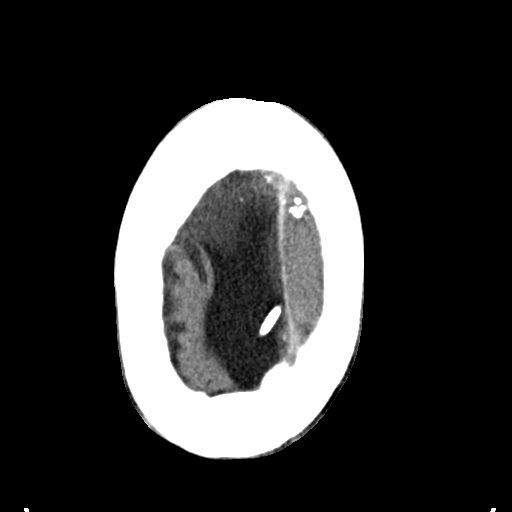
[im 30/37  brain]
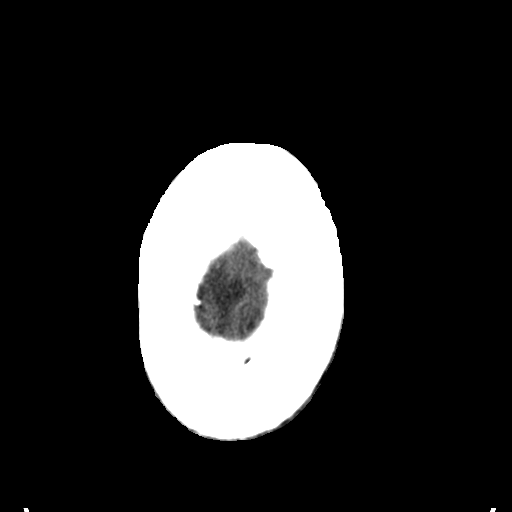
[im 34/37  brain]
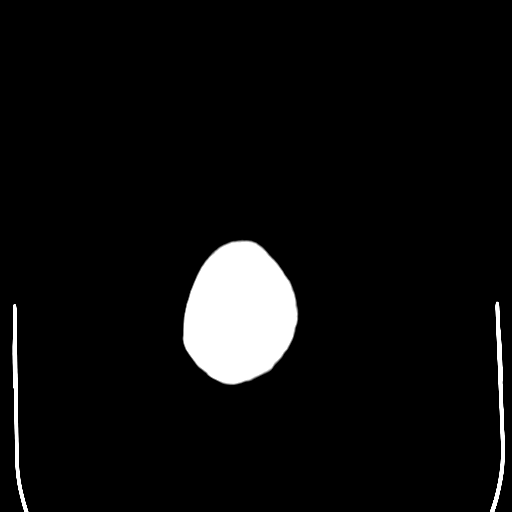
[im 34/37  bone]
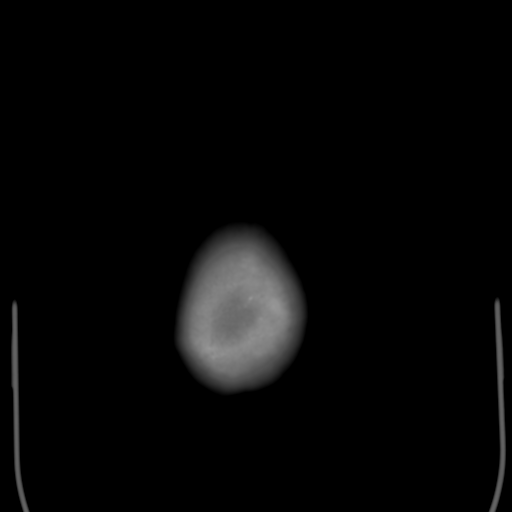

[Series 5: coronal soft · coronal · 0.36mm/px · 3 of 72 slices shown]
[im 24/72  brain]
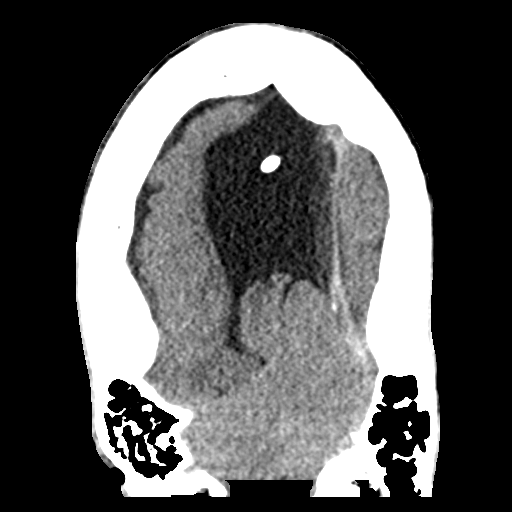
[im 32/72  brain]
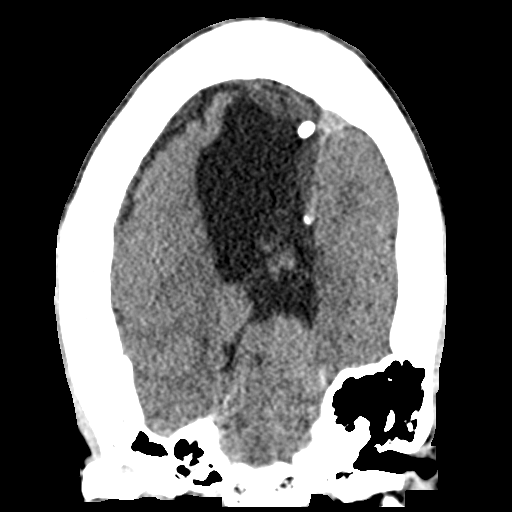
[im 40/72  brain]
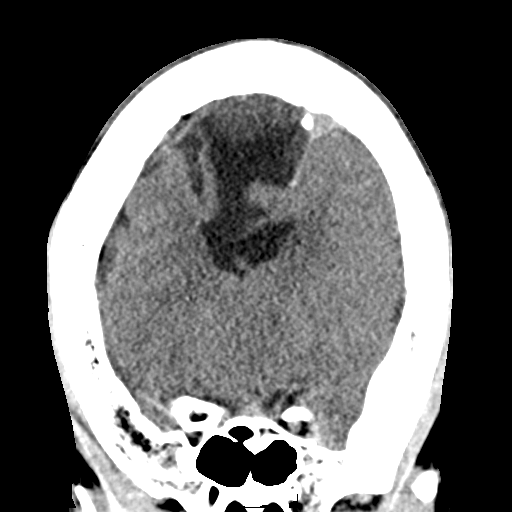

[Series 6: sag soft · sagittal · 0.36mm/px · 3 of 61 slices shown]
[im 21/61  brain]
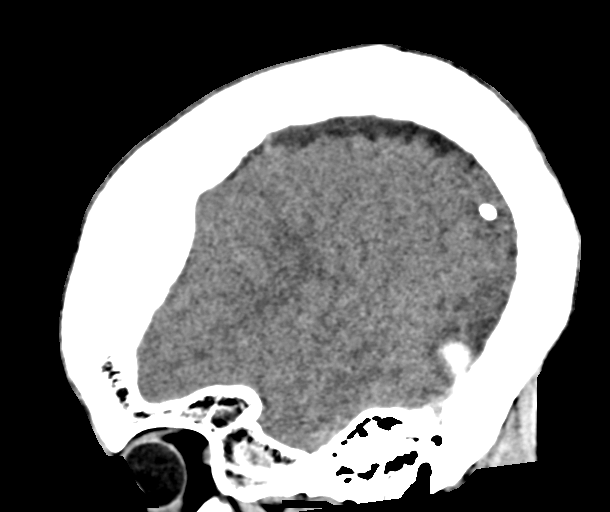
[im 31/61  brain]
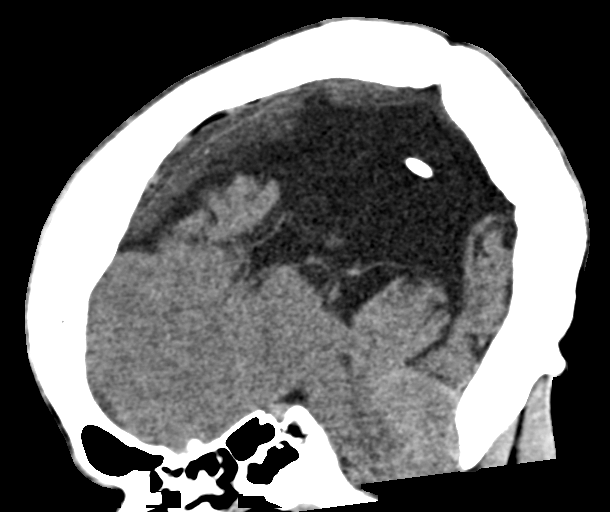
[im 41/61  brain]
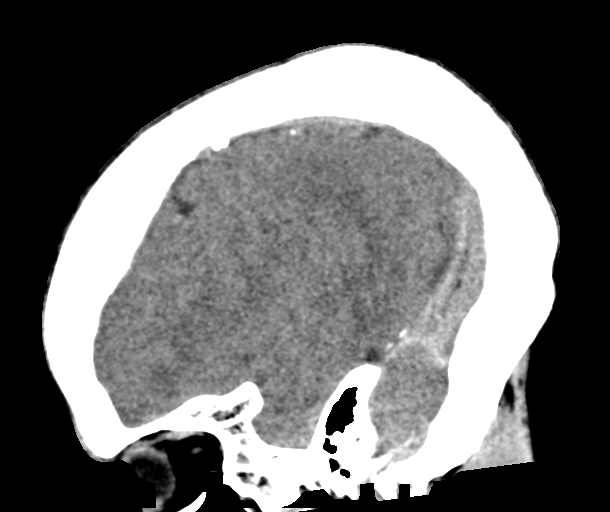

[15 of 47 positions shown; findings below may reference images not displayed]

FINDINGS: Brain: Complex congenital brain malformation again seen. Right
parietal approach VP shunt catheter in place with tip terminating
near the falx, stable. Large right paramedian mono ventricle is not
significantly changed in size or morphology. Absent septum
pellucidum and corpus callosum with fusion of the thalami.
Dysplastic cerebellum with extension of the cerebellar tonsils below
the foramen magnum. Appearance is relatively stable from prior. No
acute intracranial hemorrhage. No visible cortically based infarct.
No appreciable mass lesion, mass effect, or new midline shift.

Vascular: No abnormal hyperdense vessel.

Skull: Scalp soft tissues demonstrate no acute finding. Right
parietal burr hole craniotomy with VP shunt catheter in place.
Diffuse thickening of the diploic space noted about the calvarium.

Sinuses/Orbits: Globes and orbital soft tissues demonstrate no acute
finding. Scattered mucosal thickening noted about the ethmoidal air
cells and maxillary sinuses. Mastoid air cells and middle ear
cavities are well pneumatized and free of fluid.

Other: None.
IMPRESSION: 1. No acute intracranial abnormality.
2. Complex congenital brain malformation, likely alobar
holoprosencephaly, stable.
3. Right parietal approach VP shunt catheter in place with stable
size and morphology of monoventricle.

## 2024-02-02 ENCOUNTER — Other Ambulatory Visit: Payer: Self-pay | Admitting: Student

## 2024-02-02 DIAGNOSIS — J302 Other seasonal allergic rhinitis: Secondary | ICD-10-CM

## 2024-02-02 DIAGNOSIS — R627 Adult failure to thrive: Secondary | ICD-10-CM

## 2024-02-10 ENCOUNTER — Ambulatory Visit (INDEPENDENT_AMBULATORY_CARE_PROVIDER_SITE_OTHER): Payer: Medicare Other | Admitting: Family Medicine

## 2024-02-10 VITALS — BP 119/70 | HR 72 | Temp 98.0°F | Wt 105.6 lb

## 2024-02-10 DIAGNOSIS — B079 Viral wart, unspecified: Secondary | ICD-10-CM

## 2024-02-10 NOTE — Progress Notes (Signed)
    SUBJECTIVE:   CHIEF COMPLAINT / HPI: warts  Here with caregiver Garlan Fillers Has warts on back of head. Largest one has been here since October. Not read or itchy. Does not bother patient that caregiver knows of.  Would like frozen off cosmetically.  PERTINENT  PMH / PSH: Congenital Hydrocephalus, Intellectual Disability  OBJECTIVE:   BP 119/70 (Cuff Size: Normal)   Pulse 72   Temp 98 F (36.7 C)   Wt 105 lb 9.6 oz (47.9 kg)   SpO2 98%   BMI 18.13 kg/m   General: NAD, well appearing Neuro: A&O Respiratory: normal WOB on RA Extremities: Moving all 4 extremities equally Skin: multiple raised, exophytic irregular papules   ASSESSMENT/PLAN:   Assessment & Plan Verruca Verruca versus barbae folliculitis. More consistent with verruca given lack of erythema and overall appearance on exam.  -Cryotherapy -Repeat up to 3 sessions every 2-3 weeks if not improving  Diagnosis: Verruca Procedure: Cryotherapy Location: Posterior hair line  After discussion of the risks, benefits, and alternative therapies available, the patient elected to proceed. After obtaining written informed consent, the patient's identity, procedure, and site were verified during a time out prior to proceeding procedure. The lesions on the posterior scalp line were treated using liquid nitrogen spray gun for 6 second per cycle, 3 cycles total. The patient tolerated the procedure well and there were no immediate complications.  Patient was provided aftercare handout and advised to return if lesion(s) did not fully resolved.   Return in about 3 weeks (around 03/02/2024).  Celine Mans, MD [facility]

## 2024-02-10 NOTE — Patient Instructions (Signed)
 It was great to see you! Thank you for allowing me to participate in your care!  Our plans for today:  - Aaron Heath had cryotherapy today. - The skin may be mildly irritated. - If his warts do not fall off in the next several days, schedule an appointment in the next 2-3 weeks for repeat treatment. - You may use moisturizing lotion to help the skin. - Please avoid the barber shop until we are done with treatments   Please arrive 15 minutes PRIOR to your next scheduled appointment time! If you do not, this affects OTHER patients' care.  Take care and seek immediate care sooner if you develop any concerns.   Celine Mans, MD, PGY-2 [facility] 9:58 AM 02/10/2024  [facility]

## 2024-02-18 ENCOUNTER — Ambulatory Visit: Payer: Medicare Other

## 2024-02-18 VITALS — Ht 64.0 in | Wt 106.0 lb

## 2024-02-18 DIAGNOSIS — Z0000 Encounter for general adult medical examination without abnormal findings: Secondary | ICD-10-CM

## 2024-02-18 NOTE — Progress Notes (Signed)
 Subjective:   Aaron Heath is a 23 y.o. who presents for a Medicare Wellness preventive visit.  Visit Complete: Virtual I connected with  Aaron Heath on 02/18/24 with caregiver, Aaron Heath by a audio enabled telemedicine application and verified that I am speaking with the correct person using two identifiers.  Patient Location: Home  Provider Location: Office/Clinic  I discussed the limitations of evaluation and management by telemedicine. The patient expressed understanding and agreed to proceed.  Vital Signs: Because this visit was a virtual/telehealth visit, some criteria may be missing or patient reported. Any vitals not documented were not able to be obtained and vitals that have been documented are patient reported.  VideoError- Librarian, academic were attempted between this provider and patient, however failed, due to patient having technical difficulties OR patient did not have access to video capability.  We continued and completed visit with audio only.   AWV Questionnaire: No: Patient Medicare AWV questionnaire was not completed prior to this visit.  Cardiac Risk Factors include: male gender     Objective:    Today's Vitals   02/18/24 1425  Weight: 106 lb (48.1 kg)  Height: 5\' 4"  (1.626 m)  PainSc: 0-No pain   Body mass index is 18.19 kg/m.     02/18/2024    2:28 PM 04/23/2023   10:12 AM 04/23/2023    1:28 AM 04/22/2023    8:47 PM 03/18/2022    9:27 AM 02/22/2022    8:58 PM 10/17/2020    8:21 AM  Advanced Directives  Does Patient Have a Medical Advance Directive? No Yes Yes Yes No No No  Type of Advance Directive  Healthcare Power of [employer] Power of Attorney      Does patient want to make changes to medical advance directive?   No - Guardian declined      Copy of Healthcare Power of Attorney in Chart?   No - copy requested      Would patient like information on creating a medical advance directive? No - Patient  declined  No - Guardian declined  No - Patient declined No - Patient declined No - Patient declined    Current Medications (verified) Outpatient Encounter Medications as of 02/18/2024  Medication Sig   acetaminophen (TYLENOL) 325 MG tablet Take 2 tablets (650 mg total) by mouth every 6 (six) hours as needed.   ARIPiprazole (ABILIFY) 5 MG tablet Take 7.5 mg by mouth in the morning and at bedtime.   cetirizine (ZYRTEC) 10 MG tablet TAKE 1 TABLET BY MOUTH EVERY DAY   divalproex (DEPAKOTE) 500 MG DR tablet Take 500 mg by mouth in the morning and at bedtime.   feeding supplement (ENSURE ENLIVE / ENSURE PLUS) LIQD Take 237 mLs by mouth 2 (two) times daily between meals.   fluticasone (FLONASE) 50 MCG/ACT nasal spray PLACE 2 SPRAYS INTO BOTH NOSTRILS DAILY.   GNP VITAMIN D3 EXTRA STRENGTH 25 MCG (1000 UT) tablet TAKE 1 TABLET BY MOUTH EVERY DAY   guanFACINE (INTUNIV) 2 MG TB24 ER tablet Take 2 mg by mouth at bedtime.   hydrOXYzine (ATARAX/VISTARIL) 25 MG tablet Take 25 mg by mouth 3 (three) times daily as needed. Twice daily and once as needed   levothyroxine (SYNTHROID) 50 MCG tablet TAKE 1 TABLET BY MOUTH EVERY MORNING BEFORE BREAKFAST   melatonin 3 MG TABS tablet Take 1 tablet (3 mg total) by mouth at bedtime. (Patient taking differently: Take 10 mg by mouth at  bedtime.)   multivitamin-iron-minerals-folic acid (CENTRUM) chewable tablet Chew 1 tablet by mouth daily.   Pediatric Multiple Vitamins (ANIMAL SHAPES) CHEW TAKE 1 VITAMIN BY MOUTH DAILY.   Pediatric Multivitamins-Iron (CEROVITE JR) 18 MG CHEW CHEW ONE TABLET BY MOUTH ONCE DAILY   polyethylene glycol (MIRALAX / GLYCOLAX) 17 g packet Use one capful daily as needed   No facility-administered encounter medications on file as of 02/18/2024.    Allergies (verified) Patient has no known allergies.   History: Past Medical History:  Diagnosis Date   ADHD (attention deficit hyperactivity disorder)    Asthma    Blindness, cortical BOTH EYES    Chiari malformation    Cognitive deficits COGNITIVE LEVE AGE 51   Congenital hydrocephalus (HCC)    CP (cerebral palsy) (HCC) MILD FORM   CURRENTLY TIOLET TRAINING   Fetal alcohol syndrome    Generally unsteady    Hypoglycemia 02/14/2013   Hypotension, unspecified 02/11/2013   Optic nerve atrophy    Oral motor dysfunction OCCASIONALLY WHEN HAS TO MUCH FOOD IN MOUTH- HAS TO BE REMINDED TO SWALLOW   Pedal edema 02/14/2013   Risk for falls DUE TO CP-- WEARS HELMET   S/P VP shunt    Seizures (HCC) LAST SEIZURE 2010   NEUROLOGIST- DR Anselm Jungling- LOV 08-23-2012  AT [facility]   Sepsis (HCC) 02/11/2013   Past Surgical History:  Procedure Laterality Date   BILATERAL EYE  LATERAL RECTUS MUSCLE RECESSION AND INFERIOR OBLIQUE MUSCLE RECESSION  11-30-2003  DR Chrissie Noa [facility]   V-PATTERN EXOTROPIA   CSF SHUNT  AT BIRTH   HERNIA REPAIR  DATE UNKNOWN   LAPAROSCOPIC APPENDECTOMY N/A 04/23/2023   Procedure: APPENDECTOMY LAPAROSCOPIC;  Surgeon: Fritzi Mandes, MD;  Location: [facility];  Service: General;  Laterality: N/A;   MEDIAN RECTUS REPAIR  09/08/2012   Procedure: MEDIAN RECTUS REPAIR;  Surgeon: Corinda Gubler, MD;  Location: [facility];  Service: Ophthalmology;  Laterality: Bilateral;  inferior oblique myectomy  lateral rectus resection     History reviewed. No pertinent family history. Social History   Socioeconomic History   Marital status: Single    Spouse name: Not on file   Number of children: Not on file   Years of education: Not on file   Highest education level: Not on file  Occupational History   Not on file  Tobacco Use   Smoking status: Never    Passive exposure: Yes   Smokeless tobacco: Never  Vaping Use   Vaping status: Never Used  Substance and Sexual Activity   Alcohol use: No   Drug use: No   Sexual activity: Not on file  Other Topics Concern   Not on file  Social History Narrative   Has been placed in a group home 6/15. Biological sister is still with  foster family.    Social Drivers of Corporate investment banker Strain: Low Risk  (02/18/2024)   Overall Financial Resource Strain (CARDIA)    Difficulty of Paying Living Expenses: Not hard at all  Food Insecurity: No Food Insecurity (02/18/2024)   Hunger Vital Sign    Worried About Running Out of Food in the Last Year: Never true    Ran Out of Food in the Last Year: Never true  Transportation Needs: No Transportation Needs (02/18/2024)   PRAPARE - Administrator, Civil Service (Medical): No    Lack of Transportation (Non-Medical): No  Physical Activity: Insufficiently Active (02/18/2024)   Exercise Vital Sign  Days of Exercise per Week: 7 days    Minutes of Exercise per Session: 20 min  Stress: No Stress Concern Present (02/18/2024)   [employer] of Occupational Health - Occupational Stress Questionnaire    Feeling of Stress : Not at all  Social Connections: Socially Isolated (02/18/2024)   Social Connection and Isolation Panel [NHANES]    Frequency of Communication with Friends and Family: Never    Frequency of Social Gatherings with Friends and Family: Never    Attends Religious Services: Never    Database administrator or Organizations: No    Attends Engineer, structural: Never    Marital Status: Never married    Tobacco Counseling Counseling given: Not Answered    Clinical Intake:  Pre-visit preparation completed: Yes  Pain : No/denies pain Pain Score: 0-No pain     BMI - recorded: 18.19 Nutritional Status: BMI <19  Underweight Nutritional Risks: None Diabetes: No  How often do you need to have someone help you when you read instructions, pamphlets, or other written materials from your doctor or pharmacy?: 1 - Never What is the last grade level you completed in school?: HSG  Interpreter Needed?: No  Information entered by :: Nole Robey N. Johneisha Broaden, LPN.   Activities of Daily Living     02/18/2024    2:32 PM 04/23/2023    7:41 PM  In your  present state of health, do you have any difficulty performing the following activities:  Hearing? 0   Vision? 0   Difficulty concentrating or making decisions? 1   Walking or climbing stairs? 1   Dressing or bathing? 1   Doing errands, shopping? 1 1  Preparing Food and eating ? N   Using the Toilet? Y   In the past six months, have you accidently leaked urine? N   Do you have problems with loss of bowel control? N   Managing your Medications? Y   Managing your Finances? Y   Housekeeping or managing your Housekeeping? N     Patient Care Team: Alicia Amel, MD as PCP - General (Family Medicine) Conley Rolls, My [place], [place] as Referring Physician (Optometry)  Indicate any recent Medical Services you may have received from other than [facility] providers in the past year (date may be approximate).     Assessment:   This is a routine wellness examination for [employer].  Hearing/Vision screen Hearing Screening - Comments:: Denies hearing difficulties.  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    Goals Addressed             This Visit's Progress    Client understands the importance of follow-up with providers by attending scheduled visits         Depression Screen     02/18/2024    2:32 PM 02/06/2023    3:19 PM 09/29/2022    4:59 PM 04/02/2022    4:24 PM 03/18/2022    9:27 AM 07/03/2021    8:50 AM 04/08/2021    4:27 PM  PHQ 2/9 Scores  PHQ - 2 Score 0 0 0 0  0 0  PHQ- 9 Score 3  0 0  0 4  Exception Documentation     Other- indicate reason in comment box    Not completed  pt is delayed      Fall Risk     02/18/2024    2:29 PM 02/06/2023    3:19 PM 10/17/2020    8:22 AM  Fall Risk   Falls in the past year? 1 0 0  Number falls in past yr: 0 0 0  Injury with  Fall? 0 0   Risk for fall due to : Impaired balance/gait;History of fall(s) No Fall Risks   Follow up Falls evaluation completed;Education provided Falls evaluation completed     MEDICARE RISK AT HOME:  Medicare Risk at Home Any stairs in or around the home?: Yes If so, are there any without handrails?: No Home free of loose throw rugs in walkways, pet beds, electrical cords, etc?: Yes Adequate lighting in your home to reduce risk of falls?: No Life alert?: No Use of a cane, walker or w/c?: No Grab bars in the bathroom?: No Shower chair or bench in shower?: No Elevated toilet seat or a handicapped toilet?: No  TIMED UP AND GO:  Was the test performed?  No  Cognitive Function: Patient has a cognitive impairment.    02/18/2024    2:32 PM  MMSE - Mini Mental State Exam  Not completed: Unable to complete        02/18/2024    2:31 PM  6CIT Screen  What Year? 0 points  What month? 0 points  What time? 0 points  Count back from 20 0 points  Months in reverse 0 points  Repeat phrase 0 points  Total Score 0 points    Immunizations Immunization History  Administered Date(s) Administered   Influenza, Seasonal, Injecte, Preservative Fre 10/12/2023   Influenza,inj,Quad PF,6+ Mos 11/21/2020, 10/18/2021, 09/29/2022   PFIZER(Purple Top)SARS-COV-2 Vaccination 02/23/2020, 03/21/2020   Pfizer Covid-19 Vaccine Bivalent Booster 75yrs & up 10/18/2021    Screening Tests Health Maintenance  Topic Date Due   Pneumococcal Vaccine 51-26 Years old (1 of 2 - PCV) Never done   HPV VACCINES (1 - Male 3-dose series) Never done   DTaP/Tdap/Td (1 - Tdap) Never done   COVID-19 Vaccine (4 - 2024-25 season) 08/16/2023   Medicare Annual Wellness (AWV)  02/17/2025   INFLUENZA VACCINE  Completed   Hepatitis C Screening  Completed   HIV Screening  Completed    Health Maintenance  Health Maintenance Due  Topic Date Due   Pneumococcal Vaccine 59-76 Years old (1 of 2 - PCV) Never done   HPV  VACCINES (1 - Male 3-dose series) Never done   DTaP/Tdap/Td (1 - Tdap) Never done   COVID-19 Vaccine (4 - 2024-25 season) 08/16/2023   Health Maintenance Items Addressed: Yes; Patient is due for Pneumonia, Dtap, HPV and Covid vaccines.  Additional Screening:  Vision Screening: Recommended annual ophthalmology exams for early detection of glaucoma and other disorders of the eye.  Dental Screening: Recommended annual dental exams for proper oral hygiene  Community Resource Referral / Chronic Care Management: CRR required this visit?  No   CCM required this visit?  No     Plan:     I have personally reviewed and noted the following in the patient's chart:   Medical and social history Use of alcohol, tobacco or illicit drugs  Current medications and supplements including opioid prescriptions. Patient is not currently taking opioid prescriptions. Functional ability and status Nutritional status Physical activity Advanced directives List of other physicians Hospitalizations, surgeries, and ER visits in previous 12 months Vitals Screenings to include cognitive, depression, and falls  Referrals and appointments  In addition, I have reviewed and discussed with patient certain preventive protocols, quality metrics, and best practice recommendations. A written personalized care plan for preventive services as well as general preventive health recommendations were provided to patient.     Mickeal Needy, LPN   07/15/1913   After Visit Summary: (Declined) Due to this being a telephonic visit, with patients personalized plan was offered to patient but patient Declined AVS at this time   Notes: Please refer to Routing Comments.

## 2024-02-18 NOTE — Patient Instructions (Signed)
 Aaron Heath , Thank you for taking time to come for your Medicare Wellness Visit. I appreciate your ongoing commitment to your health goals. Please review the following plan we discussed and let me know if I can assist you in the future.   Referrals/Orders/Follow-Ups/Clinician Recommendations: Yes; Keep maintaining your health by keeping your appointments with Dr. Marisue Humble and any specialists that you may see.  Call us if you need anything.  Have a great year!!!!  This is a list of the screening recommended for you and due dates:  Health Maintenance  Topic Date Due   Pneumococcal Vaccination (1 of 2 - PCV) Never done   HPV Vaccine (1 - Male 3-dose series) Never done   DTaP/Tdap/Td vaccine (1 - Tdap) Never done   COVID-19 Vaccine (4 - 2024-25 season) 08/16/2023   Medicare Annual Wellness Visit  02/17/2025   Flu Shot  Completed   Hepatitis C Screening  Completed   HIV Screening  Completed    Advanced directives: (Declined) Advance directive discussed with you today. Even though you declined this today, please call our office should you change your mind, and we can give you the proper paperwork for you to fill out.  Next Medicare Annual Wellness Visit scheduled for next year: Yes

## 2024-02-21 ENCOUNTER — Encounter: Payer: Self-pay | Admitting: Student

## 2024-02-21 DIAGNOSIS — G809 Cerebral palsy, unspecified: Secondary | ICD-10-CM | POA: Insufficient documentation

## 2024-02-24 ENCOUNTER — Ambulatory Visit: Payer: Medicare Other

## 2024-03-01 ENCOUNTER — Ambulatory Visit: Admitting: Student

## 2024-03-01 ENCOUNTER — Other Ambulatory Visit: Payer: Self-pay | Admitting: Student

## 2024-03-01 DIAGNOSIS — E063 Autoimmune thyroiditis: Secondary | ICD-10-CM

## 2024-03-01 DIAGNOSIS — J302 Other seasonal allergic rhinitis: Secondary | ICD-10-CM

## 2024-03-01 NOTE — Progress Notes (Deleted)
    SUBJECTIVE:   CHIEF COMPLAINT / HPI: Warts f/u  Seen 2/26 for warts over posterior scalp  PERTINENT  PMH / PSH: Hypothyroidism, congenital hydrocephalus, CP  OBJECTIVE:   There were no vitals taken for this visit.   Diagnosis: *** Procedure: Cryotherapy Location: ***  After discussion of the risks, benefits, and alternative therapies available, the patient elected to proceed. After obtaining written informed consent, the patient's identity, procedure, and site were verified during a time out prior to proceeding procedure. The lesions on the *** were treated using liquid nitrogen spray gun for 6 second per cycle, {Blank single:19197::"1","2","3","***"} cycles total. The patient tolerated the procedure well and there were no immediate complications.  Patient was provided aftercare handout and advised to return if lesion(s) did not fully resolved.    ASSESSMENT/PLAN:   No problem-specific Assessment & Plan notes found for this encounter.     Levin Erp, MD [facility]

## 2024-03-28 ENCOUNTER — Other Ambulatory Visit: Payer: Self-pay | Admitting: Student

## 2024-03-28 DIAGNOSIS — J302 Other seasonal allergic rhinitis: Secondary | ICD-10-CM

## 2024-04-27 ENCOUNTER — Other Ambulatory Visit: Payer: Self-pay | Admitting: Student

## 2024-04-27 DIAGNOSIS — J302 Other seasonal allergic rhinitis: Secondary | ICD-10-CM

## 2024-04-27 DIAGNOSIS — E063 Autoimmune thyroiditis: Secondary | ICD-10-CM

## 2024-05-24 ENCOUNTER — Other Ambulatory Visit: Payer: Self-pay | Admitting: Student

## 2024-05-24 DIAGNOSIS — R627 Adult failure to thrive: Secondary | ICD-10-CM

## 2024-05-24 DIAGNOSIS — J302 Other seasonal allergic rhinitis: Secondary | ICD-10-CM

## 2024-06-21 ENCOUNTER — Other Ambulatory Visit: Payer: Self-pay

## 2024-06-21 DIAGNOSIS — J302 Other seasonal allergic rhinitis: Secondary | ICD-10-CM

## 2024-06-21 DIAGNOSIS — E063 Autoimmune thyroiditis: Secondary | ICD-10-CM

## 2024-06-25 MED ORDER — LEVOTHYROXINE SODIUM 50 MCG PO TABS: 50.0000 ug | ORAL_TABLET | Freq: Every day | ORAL | 1 refills | Status: DC

## 2024-06-25 MED ORDER — CETIRIZINE HCL 10 MG PO TABS: 10.0000 mg | ORAL_TABLET | Freq: Every day | ORAL | 0 refills | Status: DC

## 2024-07-18 ENCOUNTER — Other Ambulatory Visit: Payer: Self-pay

## 2024-07-18 DIAGNOSIS — J302 Other seasonal allergic rhinitis: Secondary | ICD-10-CM

## 2024-08-11 ENCOUNTER — Other Ambulatory Visit: Payer: Self-pay

## 2024-08-11 DIAGNOSIS — E063 Autoimmune thyroiditis: Secondary | ICD-10-CM

## 2024-09-12 ENCOUNTER — Ambulatory Visit (INDEPENDENT_AMBULATORY_CARE_PROVIDER_SITE_OTHER)

## 2024-09-12 VITALS — BP 105/72 | HR 59 | Ht 64.0 in | Wt 104.0 lb

## 2024-09-12 DIAGNOSIS — B079 Viral wart, unspecified: Secondary | ICD-10-CM | POA: Diagnosis not present

## 2024-09-12 DIAGNOSIS — E063 Autoimmune thyroiditis: Secondary | ICD-10-CM | POA: Diagnosis present

## 2024-09-12 NOTE — Assessment & Plan Note (Signed)
 Patient is compliant with Levothyroxine  50 mcg. Will recheck today. - TSH ordered - Continue Levothyroxine  50mcg

## 2024-09-12 NOTE — Assessment & Plan Note (Signed)
 Patient previously had cryotherapy in Feb 2025. Verruca have since returned. - Recommend cryotherapy with [facility] Dermatology clinic

## 2024-09-12 NOTE — Patient Instructions (Signed)
 Thank you for visiting the clinic today, it was good to see you!  Our plans for today: - Ordered TSH. I will reach out to you either via phone or MyChart once this is back. - Schedule visit with Dermatology Clinic for wart removal - Schedule nurse visit for flu shot  Please arrive 15 minutes PRIOR to your next scheduled appointment time! If you do not, this affects OTHER patients' care.  For any questions, please call the office at [phone] or send me a message in MyChart.  It was a pleasure to take care of you today. Have a great day!  Chaise Mahabir, DO [facility] Resident, PGY-1  -------------------------------------------------------------------------------  Do you need your medications delivered to your home?   We'll send your prescription to the [facility] for delivery.          Address: [address] [place], [place], [place] [address]          Phone: [phone]  Please call the [facility] to speak with a pharmacist and set up your home medication delivery. If you have any questions, feel free to contact us  -- we're happy to help!  Other [facility] that offer affordable prices on both prescriptions and over-the-counter items, as well as convenient services like vaccinations, are  [facility], at [facility]         Address:  [address], [place], [place] [address]         Phone: [phone]  [facility], located in the [facility]        Address: [address] [place], [place] [address]        Phone: [phone]  [facility], at [facility]       Address: [address], [place], [place] [address]       Phone: [phone]  [facility], at [facility]       Address: [address] Floor, [place], [place] [address]       Phone: [phone]

## 2024-09-12 NOTE — Progress Notes (Unsigned)
    SUBJECTIVE:   CHIEF COMPLAINT / HPI:   Verruca Per patient's Cryotherapy last visit. Now back. On back of head, single on L side of head an dnear L EYEBROW  PERTINENT  PMH / PSH: ***  OBJECTIVE:   BP 105/72   Pulse (!) 59   Ht 5' 4 (1.626 m)   Wt 104 lb (47.2 kg)   BMI 17.85 kg/m   ***  ASSESSMENT/PLAN:   Assessment & Plan Hypothyroidism, acquired, autoimmune Patient is compliant with Levothyroxine  50 mcg. Will recheck today. - TSH ordered Verruca Patient previously had cryotherapy in Feb 2025. Verruca have since returned. - Recommend cryotherapy with [facility] Dermatology clinic     Aaron Jernigan, DO [facility]

## 2024-09-13 ENCOUNTER — Ambulatory Visit: Payer: Self-pay

## 2024-09-13 LAB — TSH: TSH: 2.17 u[IU]/mL (ref 0.450–4.500)

## 2024-09-13 NOTE — Telephone Encounter (Signed)
 Called Vester Cleveland who is caretaker to the patient to discuss results. Confirmed name and date of birth. Reviewed results.  Recommended the following at next visit: - Flu shot - Schedule appointment with Dermatology clinic  All questions answered.  Elbert Spickler, DO [facility] Resident, PGY-1

## 2024-09-14 ENCOUNTER — Other Ambulatory Visit: Payer: Self-pay

## 2024-09-14 DIAGNOSIS — R627 Adult failure to thrive: Secondary | ICD-10-CM

## 2024-09-14 MED ORDER — GNP VITAMIN D3 EXTRA STRENGTH 25 MCG (1000 UT) PO TABS: 1000.0000 [IU] | ORAL_TABLET | Freq: Every day | ORAL | 3 refills | Status: AC

## 2024-09-30 ENCOUNTER — Encounter

## 2024-10-10 ENCOUNTER — Other Ambulatory Visit: Payer: Self-pay

## 2024-10-12 ENCOUNTER — Other Ambulatory Visit: Payer: Self-pay

## 2024-10-12 DIAGNOSIS — E063 Autoimmune thyroiditis: Secondary | ICD-10-CM

## 2024-10-20 ENCOUNTER — Other Ambulatory Visit: Payer: Self-pay | Admitting: *Deleted

## 2024-10-20 DIAGNOSIS — Z68.41 Body mass index (BMI) pediatric, less than 5th percentile for age: Secondary | ICD-10-CM

## 2024-10-20 DIAGNOSIS — J309 Allergic rhinitis, unspecified: Secondary | ICD-10-CM

## 2024-10-20 MED ORDER — FLUTICASONE PROPIONATE 50 MCG/ACT NA SUSP: 2.0000 | Freq: Every day | NASAL | 0 refills | Status: AC

## 2024-10-20 MED ORDER — POLYETHYLENE GLYCOL 3350 17 G PO PACK: PACK | ORAL | 0 refills | Status: AC

## 2024-10-20 MED ORDER — ENSURE ENLIVE PO LIQD: 237.0000 mL | Freq: Two times a day (BID) | ORAL | 12 refills | Status: AC

## 2024-10-28 ENCOUNTER — Ambulatory Visit (INDEPENDENT_AMBULATORY_CARE_PROVIDER_SITE_OTHER): Payer: MEDICAID

## 2024-10-28 VITALS — BP 111/47 | HR 73 | Ht 64.0 in | Wt 106.4 lb

## 2024-10-28 DIAGNOSIS — Z23 Encounter for immunization: Secondary | ICD-10-CM

## 2024-10-28 DIAGNOSIS — B079 Viral wart, unspecified: Secondary | ICD-10-CM | POA: Diagnosis not present

## 2024-10-28 NOTE — Progress Notes (Signed)
    SUBJECTIVE:   Chief compliant/HPI: annual examination  Aaron Heath is a 23 y.o. who presents today for an annual exam with caregiver.   Patient has not been able to get in with dermatology clinic and verruca have increased on the back of the neck and forehead. Denies itching, erythema or irritation. No other concerns. Patient is compliant with all medications.  Reviewed and updated history.   OBJECTIVE:   BP (!) 118/55   Pulse 70   Ht 5' 4 (1.626 m)   Wt 106 lb 6.4 oz (48.3 kg)   SpO2 100%   BMI 18.26 kg/m    General: Alert, well-appearing male in NAD.  HEENT: No sign of trauma, EOM grossly intact. Neck: Supple, normal ROM, no LAD, no thyromegaly, no focal tenderness Cardiovascular: RRR, no m/r/g appreciated. Pulmonary: Normal WOB. CTAB with no w/c/r present. Abdomen: Soft, non-tender, non-distended. Extremities: Warm and well-perfused, without cyanosis or edema. Neurologic: Moves all four extremities appropriately Skin: Multiple, raised exophytic papules on the back of the head, increased from last visit. Single raised exophytic papule on the forehead.   ASSESSMENT/PLAN:   Assessment & Plan Encounter for immunization - Flu vaccine administered at this visit - Due for meningococcal B vaccine which caregiver declined - Advised on COVID and Tdap which are also due. Caregiver will  reach out to their pharmacy for Tdap and will consider COVID. Verruca - Scheduled with dermatology clinic for 11/24/24 at 3:30 PM   Annual Examination  See AVS for age appropriate recommendations  PHQ score - not completed, patient has IDD  Blood pressure reviewed and at goal.  Advanced directive - not discussed  Considered the following items based upon USPSTF recommendations: Diabetes screening: discussed, not indicated HIV testing:previously completed and result reviewed, non-reactive Hepatitis C: previously completed and result reviewed, negative Hepatitis B:discussed,  immunized Syphilis if at high risk: not at high risk and not ordered GC/CT not at high risk and not ordered. Lipid panel (nonfasting or fasting) discussed based upon AHA recommendations and patient declined.  Consider repeat every 4-6 years.  Reviewed risk factors for latent tuberculosis and not indicated Vaccinations Flu.   Follow up in 1 year or sooner if indicated.  MyChart Activation: Declined  Darren Jernigan, DO [facility]

## 2024-10-28 NOTE — Assessment & Plan Note (Signed)
-   Scheduled with dermatology clinic for 11/24/24 at 3:30 PM

## 2024-10-28 NOTE — Patient Instructions (Signed)
 Thank you for visiting the clinic today, it was good to see you!  Our plans for today: - Flu shot given today - Go to your pharmacy for Tdap and COVID vaccines - Someone from the office will reach out to you to schedule you with our dermatology clinic  Please follow-up in 1 year or earlier if you have other concerns.  Please arrive 15 minutes PRIOR to your next scheduled appointment time! If you do not, this affects OTHER patients' care.  For any questions, please call the office at [phone] or send me a message in MyChart.  It was a pleasure to take care of you today. Have a great day!  Michille Mcelrath, DO [facility] Resident, PGY-1  -------------------------------------------------------------------------------  Do you need your medications delivered to your home?   We'll send your prescription to the [facility] for delivery.          Address: [address] [place], [place], [place] [address]          Phone: [phone]  Please call the [facility] to speak with a pharmacist and set up your home medication delivery. If you have any questions, feel free to contact us  -- we're happy to help!  Other [facility] that offer affordable prices on both prescriptions and over-the-counter items, as well as convenient services like vaccinations, are  [facility], at [facility]         Address:  [address], [place], [place] [address]         Phone: [phone]  [facility], located in [facility]        Address: [address] [place], [place] [address]        Phone: [phone]  [facility], at [facility]       Address: [address], [place], [place] [address]       Phone: [phone]  [facility], at [facility]       Address: [address], [place], [place] [address]        Phone: [phone]

## 2024-11-11 ENCOUNTER — Other Ambulatory Visit: Payer: Self-pay

## 2024-11-11 DIAGNOSIS — J302 Other seasonal allergic rhinitis: Secondary | ICD-10-CM

## 2024-11-24 ENCOUNTER — Ambulatory Visit

## 2025-02-20 ENCOUNTER — Encounter
